# Patient Record
Sex: Female | Born: 1950 | Race: White | Hispanic: No | Marital: Married | Smoking: Former smoker
Health system: Southern US, Community
[De-identification: ages and names within clinical notes are randomized; demographics above are authoritative.]

## PROBLEM LIST (undated history)

## (undated) DIAGNOSIS — K900 Celiac disease: Secondary | ICD-10-CM

## (undated) DIAGNOSIS — M8580 Other specified disorders of bone density and structure, unspecified site: Secondary | ICD-10-CM

## (undated) DIAGNOSIS — Z9852 Vasectomy status: Secondary | ICD-10-CM

## (undated) DIAGNOSIS — N870 Mild cervical dysplasia: Secondary | ICD-10-CM

## (undated) DIAGNOSIS — K649 Unspecified hemorrhoids: Secondary | ICD-10-CM

## (undated) HISTORY — DX: Other specified disorders of bone density and structure, unspecified site: M85.80

## (undated) HISTORY — DX: Celiac disease: K90.0

## (undated) HISTORY — PX: COLPOSCOPY: SHX161

## (undated) HISTORY — DX: Unspecified hemorrhoids: K64.9

## (undated) HISTORY — PX: OTHER SURGICAL HISTORY: SHX169

## (undated) HISTORY — DX: Vasectomy status: Z98.52

## (undated) HISTORY — PX: COSMETIC SURGERY: SHX468

## (undated) HISTORY — DX: Mild cervical dysplasia: N87.0

---

## 1998-03-19 ENCOUNTER — Encounter: Payer: Self-pay | Admitting: Obstetrics and Gynecology

## 1998-03-19 ENCOUNTER — Ambulatory Visit (HOSPITAL_COMMUNITY): Admission: RE | Admit: 1998-03-19 | Discharge: 1998-03-19 | Payer: Self-pay | Admitting: Obstetrics and Gynecology

## 1998-04-05 ENCOUNTER — Encounter: Payer: Self-pay | Admitting: Obstetrics and Gynecology

## 1998-04-05 ENCOUNTER — Ambulatory Visit (HOSPITAL_COMMUNITY): Admission: RE | Admit: 1998-04-05 | Discharge: 1998-04-05 | Payer: Self-pay | Admitting: Obstetrics and Gynecology

## 1998-09-21 ENCOUNTER — Encounter: Payer: Self-pay | Admitting: Obstetrics and Gynecology

## 1998-09-21 ENCOUNTER — Ambulatory Visit (HOSPITAL_COMMUNITY): Admission: RE | Admit: 1998-09-21 | Discharge: 1998-09-21 | Payer: Self-pay | Admitting: Obstetrics and Gynecology

## 1999-10-31 ENCOUNTER — Encounter: Payer: Self-pay | Admitting: Obstetrics and Gynecology

## 1999-10-31 ENCOUNTER — Ambulatory Visit (HOSPITAL_COMMUNITY): Admission: RE | Admit: 1999-10-31 | Discharge: 1999-10-31 | Payer: Self-pay | Admitting: Obstetrics and Gynecology

## 2000-11-22 ENCOUNTER — Encounter: Payer: Self-pay | Admitting: Obstetrics and Gynecology

## 2000-11-22 ENCOUNTER — Ambulatory Visit (HOSPITAL_COMMUNITY): Admission: RE | Admit: 2000-11-22 | Discharge: 2000-11-22 | Payer: Self-pay | Admitting: Obstetrics and Gynecology

## 2001-02-11 ENCOUNTER — Other Ambulatory Visit: Admission: RE | Admit: 2001-02-11 | Discharge: 2001-02-11 | Payer: Self-pay | Admitting: Obstetrics and Gynecology

## 2001-12-17 ENCOUNTER — Ambulatory Visit (HOSPITAL_COMMUNITY): Admission: RE | Admit: 2001-12-17 | Discharge: 2001-12-17 | Payer: Self-pay | Admitting: Obstetrics and Gynecology

## 2001-12-17 ENCOUNTER — Encounter: Payer: Self-pay | Admitting: Obstetrics and Gynecology

## 2001-12-23 ENCOUNTER — Encounter: Payer: Self-pay | Admitting: Obstetrics and Gynecology

## 2001-12-23 ENCOUNTER — Encounter: Admission: RE | Admit: 2001-12-23 | Discharge: 2001-12-23 | Payer: Self-pay | Admitting: Obstetrics and Gynecology

## 2002-05-21 ENCOUNTER — Emergency Department (HOSPITAL_COMMUNITY): Admission: EM | Admit: 2002-05-21 | Discharge: 2002-05-21 | Payer: Self-pay

## 2002-06-27 ENCOUNTER — Encounter: Payer: Self-pay | Admitting: Family Medicine

## 2002-06-27 ENCOUNTER — Ambulatory Visit (HOSPITAL_COMMUNITY): Admission: RE | Admit: 2002-06-27 | Discharge: 2002-06-27 | Payer: Self-pay | Admitting: Family Medicine

## 2003-01-29 ENCOUNTER — Other Ambulatory Visit: Admission: RE | Admit: 2003-01-29 | Discharge: 2003-01-29 | Payer: Self-pay | Admitting: Obstetrics and Gynecology

## 2003-01-29 ENCOUNTER — Ambulatory Visit (HOSPITAL_COMMUNITY): Admission: RE | Admit: 2003-01-29 | Discharge: 2003-01-29 | Payer: Self-pay | Admitting: Obstetrics and Gynecology

## 2003-02-11 ENCOUNTER — Encounter: Admission: RE | Admit: 2003-02-11 | Discharge: 2003-02-11 | Payer: Self-pay | Admitting: Obstetrics and Gynecology

## 2004-02-01 ENCOUNTER — Ambulatory Visit (HOSPITAL_COMMUNITY): Admission: RE | Admit: 2004-02-01 | Discharge: 2004-02-01 | Payer: Self-pay | Admitting: Obstetrics and Gynecology

## 2004-02-23 ENCOUNTER — Encounter: Admission: RE | Admit: 2004-02-23 | Discharge: 2004-02-23 | Payer: Self-pay | Admitting: Obstetrics and Gynecology

## 2004-04-25 ENCOUNTER — Other Ambulatory Visit: Admission: RE | Admit: 2004-04-25 | Discharge: 2004-04-25 | Payer: Self-pay | Admitting: Obstetrics and Gynecology

## 2004-08-17 ENCOUNTER — Other Ambulatory Visit: Admission: RE | Admit: 2004-08-17 | Discharge: 2004-08-17 | Payer: Self-pay | Admitting: Obstetrics and Gynecology

## 2005-01-30 ENCOUNTER — Other Ambulatory Visit: Admission: RE | Admit: 2005-01-30 | Discharge: 2005-01-30 | Payer: Self-pay | Admitting: Obstetrics and Gynecology

## 2005-02-13 ENCOUNTER — Ambulatory Visit (HOSPITAL_COMMUNITY): Admission: RE | Admit: 2005-02-13 | Discharge: 2005-02-13 | Payer: Self-pay | Admitting: Obstetrics and Gynecology

## 2006-03-08 ENCOUNTER — Ambulatory Visit (HOSPITAL_COMMUNITY): Admission: RE | Admit: 2006-03-08 | Discharge: 2006-03-08 | Payer: Self-pay | Admitting: Obstetrics and Gynecology

## 2006-03-27 ENCOUNTER — Other Ambulatory Visit: Admission: RE | Admit: 2006-03-27 | Discharge: 2006-03-27 | Payer: Self-pay | Admitting: Obstetrics and Gynecology

## 2006-05-12 ENCOUNTER — Emergency Department (HOSPITAL_COMMUNITY): Admission: EM | Admit: 2006-05-12 | Discharge: 2006-05-12 | Payer: Self-pay | Admitting: Emergency Medicine

## 2007-04-01 ENCOUNTER — Other Ambulatory Visit: Admission: RE | Admit: 2007-04-01 | Discharge: 2007-04-01 | Payer: Self-pay | Admitting: Obstetrics and Gynecology

## 2007-04-01 ENCOUNTER — Ambulatory Visit (HOSPITAL_COMMUNITY): Admission: RE | Admit: 2007-04-01 | Discharge: 2007-04-01 | Payer: Self-pay | Admitting: Obstetrics and Gynecology

## 2007-10-28 ENCOUNTER — Ambulatory Visit: Payer: Self-pay | Admitting: Obstetrics and Gynecology

## 2007-10-28 ENCOUNTER — Encounter: Payer: Self-pay | Admitting: Obstetrics and Gynecology

## 2007-10-28 ENCOUNTER — Other Ambulatory Visit: Admission: RE | Admit: 2007-10-28 | Discharge: 2007-10-28 | Payer: Self-pay | Admitting: Obstetrics and Gynecology

## 2008-01-13 ENCOUNTER — Ambulatory Visit: Payer: Self-pay | Admitting: Gynecology

## 2008-04-13 ENCOUNTER — Other Ambulatory Visit: Admission: RE | Admit: 2008-04-13 | Discharge: 2008-04-13 | Payer: Self-pay | Admitting: Obstetrics and Gynecology

## 2008-04-13 ENCOUNTER — Encounter: Payer: Self-pay | Admitting: Obstetrics and Gynecology

## 2008-04-13 ENCOUNTER — Ambulatory Visit: Payer: Self-pay | Admitting: Obstetrics and Gynecology

## 2008-04-20 ENCOUNTER — Ambulatory Visit (HOSPITAL_COMMUNITY): Admission: RE | Admit: 2008-04-20 | Discharge: 2008-04-20 | Payer: Self-pay | Admitting: Obstetrics and Gynecology

## 2008-11-15 ENCOUNTER — Emergency Department (HOSPITAL_BASED_OUTPATIENT_CLINIC_OR_DEPARTMENT_OTHER): Admission: EM | Admit: 2008-11-15 | Discharge: 2008-11-15 | Payer: Self-pay | Admitting: Emergency Medicine

## 2009-04-19 ENCOUNTER — Other Ambulatory Visit: Admission: RE | Admit: 2009-04-19 | Discharge: 2009-04-19 | Payer: Self-pay | Admitting: Obstetrics and Gynecology

## 2009-04-19 ENCOUNTER — Ambulatory Visit: Payer: Self-pay | Admitting: Obstetrics and Gynecology

## 2009-04-21 ENCOUNTER — Ambulatory Visit: Payer: Self-pay | Admitting: Obstetrics and Gynecology

## 2009-04-26 ENCOUNTER — Ambulatory Visit (HOSPITAL_COMMUNITY): Admission: RE | Admit: 2009-04-26 | Discharge: 2009-04-26 | Payer: Self-pay | Admitting: Obstetrics and Gynecology

## 2009-10-11 ENCOUNTER — Ambulatory Visit: Payer: Self-pay | Admitting: Women's Health

## 2010-01-23 ENCOUNTER — Encounter: Payer: Self-pay | Admitting: Obstetrics and Gynecology

## 2010-03-21 ENCOUNTER — Other Ambulatory Visit: Payer: Self-pay | Admitting: Obstetrics and Gynecology

## 2010-03-21 DIAGNOSIS — Z1231 Encounter for screening mammogram for malignant neoplasm of breast: Secondary | ICD-10-CM

## 2010-04-25 ENCOUNTER — Encounter (INDEPENDENT_AMBULATORY_CARE_PROVIDER_SITE_OTHER): Payer: BC Managed Care – PPO | Admitting: Obstetrics and Gynecology

## 2010-04-25 ENCOUNTER — Other Ambulatory Visit: Payer: Self-pay | Admitting: Obstetrics and Gynecology

## 2010-04-25 ENCOUNTER — Other Ambulatory Visit (HOSPITAL_COMMUNITY)
Admission: RE | Admit: 2010-04-25 | Discharge: 2010-04-25 | Disposition: A | Discharge status: Home / Self Care | Payer: BC Managed Care – PPO | Source: Ambulatory Visit | Attending: Obstetrics and Gynecology | Admitting: Obstetrics and Gynecology

## 2010-04-25 DIAGNOSIS — Z124 Encounter for screening for malignant neoplasm of cervix: Secondary | ICD-10-CM | POA: Insufficient documentation

## 2010-04-25 DIAGNOSIS — Z01419 Encounter for gynecological examination (general) (routine) without abnormal findings: Secondary | ICD-10-CM

## 2010-04-25 DIAGNOSIS — R82998 Other abnormal findings in urine: Secondary | ICD-10-CM

## 2010-05-02 ENCOUNTER — Ambulatory Visit (HOSPITAL_COMMUNITY): Payer: BC Managed Care – PPO

## 2010-05-03 ENCOUNTER — Ambulatory Visit (HOSPITAL_COMMUNITY): Payer: BC Managed Care – PPO

## 2010-05-09 ENCOUNTER — Ambulatory Visit (HOSPITAL_COMMUNITY)
Admission: RE | Admit: 2010-05-09 | Discharge: 2010-05-09 | Disposition: A | Discharge status: Home / Self Care | Payer: BC Managed Care – PPO | Source: Ambulatory Visit | Attending: Obstetrics and Gynecology | Admitting: Obstetrics and Gynecology

## 2010-05-09 DIAGNOSIS — Z1231 Encounter for screening mammogram for malignant neoplasm of breast: Secondary | ICD-10-CM | POA: Insufficient documentation

## 2010-09-09 ENCOUNTER — Other Ambulatory Visit: Payer: Self-pay

## 2010-09-09 MED ORDER — SULFAMETHOXAZOLE-TRIMETHOPRIM 800-160 MG PO TABS: 1.0000 | ORAL_TABLET | Freq: Every day | ORAL | Status: DC

## 2010-09-09 NOTE — Telephone Encounter (Signed)
Patient requests refill on generic Septra DS that she uses after intercourse to prevent UTI.  Her last CE was 04/25/10. OK?  [facility] uses [facility] [address] and I have put this on file in her EMR.

## 2010-09-09 NOTE — Telephone Encounter (Signed)
Okay 

## 2010-09-09 NOTE — Telephone Encounter (Signed)
Dr. Reece Agar got hardcopy chart before message had routed to him and came to me to tell me refill okay on patient's Septra Ds RX. He okayed #30 with 4 refills and I escribed it to [facility].

## 2010-11-08 ENCOUNTER — Ambulatory Visit (INDEPENDENT_AMBULATORY_CARE_PROVIDER_SITE_OTHER): Payer: BC Managed Care – PPO | Admitting: Obstetrics and Gynecology

## 2010-11-08 ENCOUNTER — Encounter: Payer: Self-pay | Admitting: Obstetrics and Gynecology

## 2010-11-08 DIAGNOSIS — M8580 Other specified disorders of bone density and structure, unspecified site: Secondary | ICD-10-CM | POA: Insufficient documentation

## 2010-11-08 DIAGNOSIS — N898 Other specified noninflammatory disorders of vagina: Secondary | ICD-10-CM

## 2010-11-08 DIAGNOSIS — B373 Candidiasis of vulva and vagina: Secondary | ICD-10-CM

## 2010-11-08 DIAGNOSIS — E039 Hypothyroidism, unspecified: Secondary | ICD-10-CM | POA: Insufficient documentation

## 2010-11-08 DIAGNOSIS — L293 Anogenital pruritus, unspecified: Secondary | ICD-10-CM

## 2010-11-08 DIAGNOSIS — Z9852 Vasectomy status: Secondary | ICD-10-CM | POA: Insufficient documentation

## 2010-11-08 DIAGNOSIS — K649 Unspecified hemorrhoids: Secondary | ICD-10-CM

## 2010-11-08 DIAGNOSIS — N870 Mild cervical dysplasia: Secondary | ICD-10-CM | POA: Insufficient documentation

## 2010-11-08 MED ORDER — FLUCONAZOLE 150 MG PO TABS: 150.0000 mg | ORAL_TABLET | Freq: Once | ORAL | Status: AC

## 2010-11-08 NOTE — Progress Notes (Signed)
The patient came to see me today with a one-week history of itching at her perineum, feeling that a hemorrhoid had prolapsed, and generalized discomfort in this area.  Exam: Kennon Portela present.  External: Generalized erythema and irritation at perineum. Small hemorrhoid at 9:00 somewhat firm but not hard. BUS: With within normal limits. Vaginal exam: Some discharge. Wet prep positive for yeast. Rectal exam: Negative  Assessment: #1. Yeast vaginitis #2. External hemorrhoid  Plan: Diflucan 150 mg daily for 4 days. Analpram advanced kit. If firmness persists call back and we will refer to a surgeon.

## 2011-04-04 ENCOUNTER — Other Ambulatory Visit: Payer: Self-pay | Admitting: Obstetrics and Gynecology

## 2011-04-18 ENCOUNTER — Other Ambulatory Visit: Payer: Self-pay | Admitting: Obstetrics and Gynecology

## 2011-04-18 DIAGNOSIS — Z1231 Encounter for screening mammogram for malignant neoplasm of breast: Secondary | ICD-10-CM

## 2011-04-20 ENCOUNTER — Other Ambulatory Visit: Payer: Self-pay | Admitting: Obstetrics and Gynecology

## 2011-04-20 DIAGNOSIS — M8580 Other specified disorders of bone density and structure, unspecified site: Secondary | ICD-10-CM

## 2011-04-26 ENCOUNTER — Ambulatory Visit (INDEPENDENT_AMBULATORY_CARE_PROVIDER_SITE_OTHER): Payer: BC Managed Care – PPO

## 2011-04-26 DIAGNOSIS — M949 Disorder of cartilage, unspecified: Secondary | ICD-10-CM

## 2011-04-26 DIAGNOSIS — M899 Disorder of bone, unspecified: Secondary | ICD-10-CM

## 2011-04-26 DIAGNOSIS — M8580 Other specified disorders of bone density and structure, unspecified site: Secondary | ICD-10-CM

## 2011-05-01 ENCOUNTER — Other Ambulatory Visit (HOSPITAL_COMMUNITY)
Admission: RE | Admit: 2011-05-01 | Discharge: 2011-05-01 | Disposition: A | Discharge status: Home / Self Care | Payer: BC Managed Care – PPO | Source: Ambulatory Visit | Attending: Obstetrics and Gynecology | Admitting: Obstetrics and Gynecology

## 2011-05-01 ENCOUNTER — Ambulatory Visit (INDEPENDENT_AMBULATORY_CARE_PROVIDER_SITE_OTHER): Payer: BC Managed Care – PPO | Admitting: Obstetrics and Gynecology

## 2011-05-01 ENCOUNTER — Encounter: Payer: Self-pay | Admitting: Obstetrics and Gynecology

## 2011-05-01 VITALS — BP 124/78 | Ht 68.0 in | Wt 188.0 lb

## 2011-05-01 DIAGNOSIS — Z01419 Encounter for gynecological examination (general) (routine) without abnormal findings: Secondary | ICD-10-CM | POA: Insufficient documentation

## 2011-05-01 DIAGNOSIS — K900 Celiac disease: Secondary | ICD-10-CM | POA: Insufficient documentation

## 2011-05-01 DIAGNOSIS — K649 Unspecified hemorrhoids: Secondary | ICD-10-CM | POA: Insufficient documentation

## 2011-05-01 NOTE — Progress Notes (Signed)
Patient came to see me today for her annual exam. She is doing well without hormone replacement. Bone density showed stability with low bone mass and without an elevated FRAX risk. She is doing well with her hyperthyroidism which has now been replaced with Synthroid. She continues to use the analpram I give her for hemorrhoids. She is having no vaginal bleeding. She is having no pelvic pain. She is up-to-date on her mammograms. She has not had CIN on Pap smear since 2006.  Physical examination: Kennon Portela present. HEENT within normal limits. Neck: Thyroid not large. No masses. Supraclavicular nodes: not enlarged. Breasts: Examined in both sitting and lying  position. No skin changes and no masses. Abdomen: Soft no guarding rebound or masses or hernia. Pelvic: External: Within normal limits. BUS: Within normal limits. Vaginal:within normal limits. Good estrogen effect. No evidence of cystocele rectocele or enterocele. Cervix: clean. Uterus: Normal size and shape. Adnexa: No masses. Rectovaginal exam: Confirmatory and negative. Extremities: Within normal limits.  Assessment: #1. Hyperthyroidism- now on Synthroid. #2. Osteopenia #3. Hemorrhoids  Plan: Mammogram in May. Periodic bone densities.Analpram as needed. Patient to discuss with GI doctor banding of hemorrhoids.

## 2011-05-02 LAB — URINALYSIS W MICROSCOPIC + REFLEX CULTURE
Protein, ur: NEGATIVE mg/dL
Specific Gravity, Urine: 1.013 (ref 1.005–1.030)
Urobilinogen, UA: 0.2 mg/dL (ref 0.0–1.0)

## 2011-05-02 LAB — CBC WITH DIFFERENTIAL/PLATELET
Eosinophils Absolute: 0.1 10*3/uL (ref 0.0–0.7)
Eosinophils Relative: 2 % (ref 0–5)
HCT: 43.5 % (ref 36.0–46.0)
Hemoglobin: 14 g/dL (ref 12.0–15.0)
Lymphs Abs: 1.7 10*3/uL (ref 0.7–4.0)
MCH: 29.9 pg (ref 26.0–34.0)
MCV: 92.9 fL (ref 78.0–100.0)
Monocytes Absolute: 0.3 10*3/uL (ref 0.1–1.0)
Monocytes Relative: 7 % (ref 3–12)
RBC: 4.68 MIL/uL (ref 3.87–5.11)

## 2011-05-02 LAB — LIPID PANEL
Cholesterol: 190 mg/dL (ref 0–200)
LDL Cholesterol: 116 mg/dL — ABNORMAL HIGH (ref 0–99)
Total CHOL/HDL Ratio: 3.3 Ratio
VLDL: 16 mg/dL (ref 0–40)

## 2011-05-04 LAB — URINE CULTURE

## 2011-05-15 ENCOUNTER — Ambulatory Visit (HOSPITAL_COMMUNITY): Payer: BC Managed Care – PPO

## 2011-06-12 ENCOUNTER — Ambulatory Visit (HOSPITAL_COMMUNITY)
Admission: RE | Admit: 2011-06-12 | Discharge: 2011-06-12 | Disposition: A | Discharge status: Home / Self Care | Payer: BC Managed Care – PPO | Source: Ambulatory Visit | Attending: Obstetrics and Gynecology | Admitting: Obstetrics and Gynecology

## 2011-06-12 DIAGNOSIS — Z1231 Encounter for screening mammogram for malignant neoplasm of breast: Secondary | ICD-10-CM | POA: Insufficient documentation

## 2011-11-09 ENCOUNTER — Other Ambulatory Visit: Payer: Self-pay | Admitting: Gynecology

## 2012-06-10 ENCOUNTER — Other Ambulatory Visit: Payer: Self-pay | Admitting: Gynecology

## 2012-06-10 DIAGNOSIS — Z1231 Encounter for screening mammogram for malignant neoplasm of breast: Secondary | ICD-10-CM

## 2012-06-21 ENCOUNTER — Ambulatory Visit (HOSPITAL_COMMUNITY)
Admission: RE | Admit: 2012-06-21 | Discharge: 2012-06-21 | Disposition: A | Discharge status: Home / Self Care | Payer: BC Managed Care – PPO | Source: Ambulatory Visit | Attending: Gynecology | Admitting: Gynecology

## 2012-06-21 DIAGNOSIS — Z1231 Encounter for screening mammogram for malignant neoplasm of breast: Secondary | ICD-10-CM | POA: Insufficient documentation

## 2012-08-05 ENCOUNTER — Ambulatory Visit (INDEPENDENT_AMBULATORY_CARE_PROVIDER_SITE_OTHER): Payer: BC Managed Care – PPO | Admitting: Women's Health

## 2012-08-05 ENCOUNTER — Encounter: Payer: Self-pay | Admitting: Women's Health

## 2012-08-05 VITALS — BP 120/80 | Ht 68.0 in | Wt 175.0 lb

## 2012-08-05 DIAGNOSIS — Z23 Encounter for immunization: Secondary | ICD-10-CM

## 2012-08-05 DIAGNOSIS — Z01419 Encounter for gynecological examination (general) (routine) without abnormal findings: Secondary | ICD-10-CM

## 2012-08-05 MED ORDER — HYDROCORTISONE ACE-PRAMOXINE 2.5-1 % RE CREA: TOPICAL_CREAM | Freq: Two times a day (BID) | RECTAL | Status: DC

## 2012-08-05 NOTE — Patient Instructions (Addendum)
Health Recommendations for Postmenopausal Women Respected and ongoing research has looked at the most common causes of death, disability, and poor quality of life in postmenopausal women. The causes include heart disease, diseases of blood vessels, diabetes, depression, cancer, and bone loss (osteoporosis). Many things can be done to help lower the chances of developing these and other common problems: CARDIOVASCULAR DISEASE Heart Disease: A heart attack is a medical emergency. Know the signs and symptoms of a heart attack. Below are things women can do to reduce their risk for heart disease.   Do not smoke. If you smoke, quit.  Aim for a healthy weight. Being overweight causes many preventable deaths. Eat a healthy and balanced diet and drink an adequate amount of liquids.  Get moving. Make a commitment to be more physically active. Aim for 30 minutes of activity on most, if not all days of the week.  Eat for heart health. Choose a diet that is low in saturated fat and cholesterol and eliminate trans fat. Include whole grains, vegetables, and fruits. Read and understand the labels on food containers before buying.  Know your numbers. Ask your caregiver to check your blood pressure, cholesterol (total, HDL, LDL, triglycerides) and blood glucose. Work with your caregiver on improving your entire clinical picture.  High blood pressure. Limit or stop your table salt intake (try salt substitute and food seasonings). Avoid salty foods and drinks. Read labels on food containers before buying. Eating well and exercising can help control high blood pressure. STROKE  Stroke is a medical emergency. Stroke may be the result of a blood clot in a blood vessel in the brain or by a brain hemorrhage (bleeding). Know the signs and symptoms of a stroke. To lower the risk of developing a stroke:  Avoid fatty foods.  Quit smoking.  Control your diabetes, blood pressure, and irregular heart rate. THROMBOPHLEBITIS  (BLOOD CLOT) OF THE LEG  Becoming overweight and leading a stationary lifestyle may also contribute to developing blood clots. Controlling your diet and exercising will help lower the risk of developing blood clots. CANCER SCREENING  Breast Cancer: Take steps to reduce your risk of breast cancer.  You should practice "breast self-awareness." This means understanding the normal appearance and feel of your breasts and should include breast self-examination. Any changes detected, no matter how small, should be reported to your caregiver.  After age 40, you should have a clinical breast exam (CBE) every year.  Starting at age 40, you should consider having a mammogram (breast X-ray) every year.  If you have a family history of breast cancer, talk to your caregiver about genetic screening.  If you are at high risk for breast cancer, talk to your caregiver about having an MRI and a mammogram every year.  Intestinal or Stomach Cancer: Tests to consider are a rectal exam, fecal occult blood, sigmoidoscopy, and colonoscopy. Women who are high risk may need to be screened at an earlier age and more often.  Cervical Cancer:  Beginning at age 30, you should have a Pap test every 3 years as long as the past 3 Pap tests have been normal.  If you have had past treatment for cervical cancer or a condition that could lead to cancer, you need Pap tests and screening for cancer for at least 20 years after your treatment.  If you had a hysterectomy for a problem that was not cancer or a condition that could lead to cancer, then you no longer need Pap tests.    If you are between ages 65 and 70, and you have had normal Pap tests going back 10 years, you no longer need Pap tests.  If Pap tests have been discontinued, risk factors (such as a new sexual partner) need to be reassessed to determine if screening should be resumed.  Some medical problems can increase the chance of getting cervical cancer. In these  cases, your caregiver may recommend more frequent screening and Pap tests.  Uterine Cancer: If you have vaginal bleeding after reaching menopause, you should notify your caregiver.  Ovarian cancer: Other than yearly pelvic exams, there are no reliable tests available to screen for ovarian cancer at this time except for yearly pelvic exams.  Lung Cancer: Yearly chest X-rays can detect lung cancer and should be done on high risk women, such as cigarette smokers and women with chronic lung disease (emphysema).  Skin Cancer: A complete body skin exam should be done at your yearly examination. Avoid overexposure to the sun and ultraviolet light lamps. Use a strong sun block cream when in the sun. All of these things are important in lowering the risk of skin cancer. MENOPAUSE Menopause Symptoms: Hormone therapy products are effective for treating symptoms associated with menopause:  Moderate to severe hot flashes.  Night sweats.  Mood swings.  Headaches.  Tiredness.  Loss of sex drive.  Insomnia.  Other symptoms. Hormone replacement carries certain risks, especially in older women. Women who use or are thinking about using estrogen or estrogen with progestin treatments should discuss that with their caregiver. Your caregiver will help you understand the benefits and risks. The ideal dose of hormone replacement therapy is not known. The Food and Drug Administration (FDA) has concluded that hormone therapy should be used only at the lowest doses and for the shortest amount of time to reach treatment goals.  OSTEOPOROSIS Protecting Against Bone Loss and Preventing Fracture: If you use hormone therapy for prevention of bone loss (osteoporosis), the risks for bone loss must outweigh the risk of the therapy. Ask your caregiver about other medications known to be safe and effective for preventing bone loss and fractures. To guard against bone loss or fractures, the following is recommended:  If  you are less than age 50, take 1000 mg of calcium and at least 600 mg of Vitamin D per day.  If you are greater than age 50 but less than age 70, take 1200 mg of calcium and at least 600 mg of Vitamin D per day.  If you are greater than age 70, take 1200 mg of calcium and at least 800 mg of Vitamin D per day. Smoking and excessive alcohol intake increases the risk of osteoporosis. Eat foods rich in calcium and vitamin D and do weight bearing exercises several times a week as your caregiver suggests. DIABETES Diabetes Melitus: If you have Type I or Type 2 diabetes, you should keep your blood sugar under control with diet, exercise and recommended medication. Avoid too many sweets, starchy and fatty foods. Being overweight can make control more difficult. COGNITION AND MEMORY Cognition and Memory: Menopausal hormone therapy is not recommended for the prevention of cognitive disorders such as Alzheimer's disease or memory loss.  DEPRESSION  Depression may occur at any age, but is common in elderly women. The reasons may be because of physical, medical, social (loneliness), or financial problems and needs. If you are experiencing depression because of medical problems and control of symptoms, talk to your caregiver about this. Physical activity and   exercise may help with mood and sleep. Community and volunteer involvement may help your sense of value and worth. If you have depression and you feel that the problem is getting worse or becoming severe, talk to your caregiver about treatment options that are best for you. ACCIDENTS  Accidents are common and can be serious in the elderly woman. Prepare your house to prevent accidents. Eliminate throw rugs, place hand bars in the bath, shower and toilet areas. Avoid wearing high heeled shoes or walking on wet, snowy, and icy areas. Limit or stop driving if you have vision or hearing problems, or you feel you are unsteady with you movements and  reflexes. HEPATITIS C Hepatitis C is a type of viral infection affecting the liver. It is spread mainly through contact with blood from an infected person. It can be treated, but if left untreated, it can lead to severe liver damage over years. Many people who are infected do not know that the virus is in their blood. If you are a "baby-boomer", it is recommended that you have one screening test for Hepatitis C. IMMUNIZATIONS  Several immunizations are important to consider having during your senior years, including:   Tetanus, diptheria, and pertussis booster shot.  Influenza every year before the flu season begins.  Pneumonia vaccine.  Shingles vaccine.  Others as indicated based on your specific needs. Talk to your caregiver about these. Document Released: 02/10/2005 Document Revised: 12/06/2011 Document Reviewed: 10/07/2007 Exit[employer] Patient Information 2014 [employer].  

## 2012-08-05 NOTE — Progress Notes (Signed)
Donna Haney 1950-12-02 [id number]    History:    The patient presents for annual exam.  Postmenopausal on no HRT/no bleeding. Quit smoking last year. 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 Osteopenia, T score -2.0 left femoral neck FRAX 9.5%/2%. Normal mammogram history. Negative colonoscopy 2011. Has had some problems with hemorrhoids with good relief with Analpram.  Past medical history, past surgical history, family history and social history were all reviewed and documented in the EPIC chart. 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 Has had some problems with low-back pain. Hypothyroidism iliac primary care manages labs and meds. Grandson Donna Haney 45mo had a cord injury at birth making upper body strength weak, doing better still not walking.  ROS:  A  ROS was performed and pertinent positives and negatives are included in the history.  Exam:  Filed Vitals:   08/05/12 1529  BP: 120/80    General appearance:  Normal Head/Neck:  Normal, without cervical or supraclavicular adenopathy. Thyroid:  Symmetrical, normal in size, without palpable masses or nodularity. Respiratory  Effort:  Normal  Auscultation:  Clear without wheezing or rhonchi Cardiovascular  Auscultation:  Regular rate, without rubs, murmurs or gallops  Edema/varicosities:  Not grossly evident Abdominal  Soft,nontender, without masses, guarding or rebound.  Liver/spleen:  No organomegaly noted  Hernia:  None appreciated  Skin  Inspection:  Grossly normal  Palpation:  Grossly normal Neurologic/psychiatric  Orientation:  Normal with appropriate conversation.  Mood/affect:  Normal  Genitourinary    Breasts: Examined lying and sitting.     Right: Without masses, retractions, discharge or axillary adenopathy.     Left: Without masses, retractions, discharge or axillary adenopathy.   Inguinal/mons:  Normal without inguinal adenopathy  External genitalia:   Normal  BUS/Urethra/Skene's glands:  Normal  Bladder:  Normal  Vagina:  Normal  Cervix:  Normal  Uterus:   normal in size, shape and contour.  Midline and mobile  Adnexa/parametria:     Rt: Without masses or tenderness.   Lt: Without masses or tenderness.  Anus and perineum: Normal  Digital rectal exam: Normal sphincter tone without palpated masses or tenderness  Assessment/Plan:  62 y.o. DW F. G2 P2 for annual exam with no complaints.  Postmenopausal/no bleeding/no HRT Osteopenic  without elevated FRAX Hypothyroid/celiac-primary care labs and meds  Name: [name], continue annual mammogram, noted breasts were dense reviewed 3-D tomography and encouraged. Analpram prescription for occasional hemorrhoids layers given. Congratulated on quitting smoking. Encouraged to continue active lifestyle, regular exercise, calcium rich foods, vitamin D 2000 daily. Home safety, fall prevention and importance of exercise for bone health reviewed. Pap, CIN-1 2006. Condoms encouraged if become sexually active. Tdap vaccine given.     [facility], 4:51 PM 08/05/2012

## 2012-08-07 ENCOUNTER — Encounter: Payer: Self-pay | Admitting: Gynecology

## 2013-05-30 ENCOUNTER — Other Ambulatory Visit: Payer: Self-pay | Admitting: Occupational Medicine

## 2013-05-30 ENCOUNTER — Ambulatory Visit: Payer: Self-pay

## 2013-05-30 DIAGNOSIS — M25561 Pain in right knee: Secondary | ICD-10-CM

## 2013-06-09 ENCOUNTER — Other Ambulatory Visit: Payer: Self-pay | Admitting: Women's Health

## 2013-06-09 ENCOUNTER — Telehealth: Payer: Self-pay | Admitting: *Deleted

## 2013-06-09 MED ORDER — SULFAMETHOXAZOLE-TRIMETHOPRIM 800-160 MG PO TABS: 1.0000 | ORAL_TABLET | Freq: Every day | ORAL | Status: DC

## 2013-06-09 NOTE — Telephone Encounter (Signed)
Ok, review/ask if ever tried a lower dose such as macrodantin 50mg ?  If not she could try, review to take with food.  #30.  If doesn't want to try ok for septra, obviously doesn't use often.

## 2013-06-09 NOTE — Telephone Encounter (Signed)
Pt would like to keep Rx for septra, rx sent.

## 2013-06-09 NOTE — Telephone Encounter (Signed)
(  pt aware you are out of the office) Pt called request refill for Bactrim DS that is used for after sex to prevent UTI's. Pt said Dr.Gottensgen prescribed this originally her would give #30 with refills annual due in august 2015. Please advise

## 2013-06-09 NOTE — Telephone Encounter (Signed)
Left message for pt to call.

## 2013-06-23 ENCOUNTER — Other Ambulatory Visit: Payer: Self-pay | Admitting: Women's Health

## 2013-06-23 DIAGNOSIS — Z1231 Encounter for screening mammogram for malignant neoplasm of breast: Secondary | ICD-10-CM

## 2013-06-27 ENCOUNTER — Ambulatory Visit (HOSPITAL_COMMUNITY)
Admission: RE | Admit: 2013-06-27 | Discharge: 2013-06-27 | Disposition: A | Discharge status: Home / Self Care | Payer: BC Managed Care – PPO | Source: Ambulatory Visit | Attending: Women's Health | Admitting: Women's Health

## 2013-06-27 DIAGNOSIS — Z1231 Encounter for screening mammogram for malignant neoplasm of breast: Secondary | ICD-10-CM | POA: Insufficient documentation

## 2013-08-08 ENCOUNTER — Ambulatory Visit (INDEPENDENT_AMBULATORY_CARE_PROVIDER_SITE_OTHER): Payer: BC Managed Care – PPO | Admitting: Women's Health

## 2013-08-08 ENCOUNTER — Other Ambulatory Visit (HOSPITAL_COMMUNITY)
Admission: RE | Admit: 2013-08-08 | Discharge: 2013-08-08 | Disposition: A | Discharge status: Home / Self Care | Payer: BC Managed Care – PPO | Source: Ambulatory Visit | Attending: Women's Health | Admitting: Women's Health

## 2013-08-08 ENCOUNTER — Encounter: Payer: Self-pay | Admitting: Women's Health

## 2013-08-08 VITALS — BP 115/76 | Ht 68.0 in | Wt 165.6 lb

## 2013-08-08 DIAGNOSIS — M8580 Other specified disorders of bone density and structure, unspecified site: Secondary | ICD-10-CM

## 2013-08-08 DIAGNOSIS — M949 Disorder of cartilage, unspecified: Secondary | ICD-10-CM

## 2013-08-08 DIAGNOSIS — Z01419 Encounter for gynecological examination (general) (routine) without abnormal findings: Secondary | ICD-10-CM

## 2013-08-08 DIAGNOSIS — Z113 Encounter for screening for infections with a predominantly sexual mode of transmission: Secondary | ICD-10-CM

## 2013-08-08 DIAGNOSIS — N390 Urinary tract infection, site not specified: Secondary | ICD-10-CM

## 2013-08-08 DIAGNOSIS — Z1151 Encounter for screening for human papillomavirus (HPV): Secondary | ICD-10-CM | POA: Insufficient documentation

## 2013-08-08 DIAGNOSIS — K649 Unspecified hemorrhoids: Secondary | ICD-10-CM

## 2013-08-08 DIAGNOSIS — M899 Disorder of bone, unspecified: Secondary | ICD-10-CM

## 2013-08-08 LAB — VITAMIN D 25 HYDROXY (VIT D DEFICIENCY, FRACTURES): Vit D, 25-Hydroxy: 49 ng/mL (ref 30–89)

## 2013-08-08 LAB — HEPATITIS C ANTIBODY: HCV AB: NEGATIVE

## 2013-08-08 LAB — RPR

## 2013-08-08 LAB — HIV ANTIBODY (ROUTINE TESTING W REFLEX): HIV: NONREACTIVE

## 2013-08-08 LAB — HEPATITIS B SURFACE ANTIGEN: HEP B S AG: NEGATIVE

## 2013-08-08 MED ORDER — HYDROCORTISONE ACE-PRAMOXINE 2.5-1 % RE CREA: TOPICAL_CREAM | Freq: Two times a day (BID) | RECTAL | Status: DC

## 2013-08-08 MED ORDER — NITROFURANTOIN MACROCRYSTAL 50 MG PO CAPS: 50.0000 mg | ORAL_CAPSULE | Freq: Four times a day (QID) | ORAL | Status: DC

## 2013-08-08 NOTE — Patient Instructions (Signed)
Health Recommendations for Postmenopausal Women Respected and ongoing research has looked at the most common causes of death, disability, and poor quality of life in postmenopausal women. The causes include heart disease, diseases of blood vessels, diabetes, depression, cancer, and bone loss (osteoporosis). Many things can be done to help lower the chances of developing these and other common problems. CARDIOVASCULAR DISEASE Heart Disease: A heart attack is a medical emergency. Know the signs and symptoms of a heart attack. Below are things women can do to reduce their risk for heart disease.   Do not smoke. If you smoke, quit.  Aim for a healthy weight. Being overweight causes many preventable deaths. Eat a healthy and balanced diet and drink an adequate amount of liquids.  Get moving. Make a commitment to be more physically active. Aim for 30 minutes of activity on most, if not all days of the week.  Eat for heart health. Choose a diet that is low in saturated fat and cholesterol and eliminate trans fat. Include whole grains, vegetables, and fruits. Read and understand the labels on food containers before buying.  Know your numbers. Ask your caregiver to check your blood pressure, cholesterol (total, HDL, LDL, triglycerides) and blood glucose. Work with your caregiver on improving your entire clinical picture.  High blood pressure. Limit or stop your table salt intake (try salt substitute and food seasonings). Avoid salty foods and drinks. Read labels on food containers before buying. Eating well and exercising can help control high blood pressure. STROKE  Stroke is a medical emergency. Stroke may be the result of a blood clot in a blood vessel in the brain or by a brain hemorrhage (bleeding). Know the signs and symptoms of a stroke. To lower the risk of developing a stroke:  Avoid fatty foods.  Quit smoking.  Control your diabetes, blood pressure, and irregular heart rate. THROMBOPHLEBITIS  (BLOOD CLOT) OF THE LEG  Becoming overweight and leading a stationary lifestyle may also contribute to developing blood clots. Controlling your diet and exercising will help lower the risk of developing blood clots. CANCER SCREENING  Breast Cancer: Take steps to reduce your risk of breast cancer.  You should practice "breast self-awareness." This means understanding the normal appearance and feel of your breasts and should include breast self-examination. Any changes detected, no matter how small, should be reported to your caregiver.  After age 40, you should have a clinical breast exam (CBE) every year.  Starting at age 40, you should consider having a mammogram (breast X-ray) every year.  If you have a family history of breast cancer, talk to your caregiver about genetic screening.  If you are at high risk for breast cancer, talk to your caregiver about having an MRI and a mammogram every year.  Intestinal or Stomach Cancer: Tests to consider are a rectal exam, fecal occult blood, sigmoidoscopy, and colonoscopy. Women who are high risk may need to be screened at an earlier age and more often.  Cervical Cancer:  Beginning at age 30, you should have a Pap test every 3 years as long as the past 3 Pap tests have been normal.  If you have had past treatment for cervical cancer or a condition that could lead to cancer, you need Pap tests and screening for cancer for at least 20 years after your treatment.  If you had a hysterectomy for a problem that was not cancer or a condition that could lead to cancer, then you no longer need Pap tests.    If you are between ages 65 and 70, and you have had normal Pap tests going back 10 years, you no longer need Pap tests.  If Pap tests have been discontinued, risk factors (such as a new sexual partner) need to be reassessed to determine if screening should be resumed.  Some medical problems can increase the chance of getting cervical cancer. In these  cases, your caregiver may recommend more frequent screening and Pap tests.  Uterine Cancer: If you have vaginal bleeding after reaching menopause, you should notify your caregiver.  Ovarian Cancer: Other than yearly pelvic exams, there are no reliable tests available to screen for ovarian cancer at this time except for yearly pelvic exams.  Lung Cancer: Yearly chest X-rays can detect lung cancer and should be done on high risk women, such as cigarette smokers and women with chronic lung disease (emphysema).  Skin Cancer: A complete body skin exam should be done at your yearly examination. Avoid overexposure to the sun and ultraviolet light lamps. Use a strong sun block cream when in the sun. All of these things are important for lowering the risk of skin cancer. MENOPAUSE Menopause Symptoms: Hormone therapy products are effective for treating symptoms associated with menopause:  Moderate to severe hot flashes.  Night sweats.  Mood swings.  Headaches.  Tiredness.  Loss of sex drive.  Insomnia.  Other symptoms. Hormone replacement carries certain risks, especially in older women. Women who use or are thinking about using estrogen or estrogen with progestin treatments should discuss that with their caregiver. Your caregiver will help you understand the benefits and risks. The ideal dose of hormone replacement therapy is not known. The Food and Drug Administration (FDA) has concluded that hormone therapy should be used only at the lowest doses and for the shortest amount of time to reach treatment goals.  OSTEOPOROSIS Protecting Against Bone Loss and Preventing Fracture If you use hormone therapy for prevention of bone loss (osteoporosis), the risks for bone loss must outweigh the risk of the therapy. Ask your caregiver about other medications known to be safe and effective for preventing bone loss and fractures. To guard against bone loss or fractures, the following is recommended:  If  you are younger than age 50, take 1000 mg of calcium and at least 600 mg of Vitamin D per day.  If you are older than age 50 but younger than age 70, take 1200 mg of calcium and at least 600 mg of Vitamin D per day.  If you are older than age 70, take 1200 mg of calcium and at least 800 mg of Vitamin D per day. Smoking and excessive alcohol intake increases the risk of osteoporosis. Eat foods rich in calcium and vitamin D and do weight bearing exercises several times a week as your caregiver suggests. DIABETES Diabetes Mellitus: If you have type I or type 2 diabetes, you should keep your blood sugar under control with diet, exercise, and recommended medication. Avoid starchy and fatty foods, and too many sweets. Being overweight can make diabetes control more difficult. COGNITION AND MEMORY Cognition and Memory: Menopausal hormone therapy is not recommended for the prevention of cognitive disorders such as Alzheimer's disease or memory loss.  DEPRESSION  Depression may occur at any age, but it is common in elderly women. This may be because of physical, medical, social (loneliness), or financial problems and needs. If you are experiencing depression because of medical problems and control of symptoms, talk to your caregiver about this. Physical   activity and exercise may help with mood and sleep. Community and volunteer involvement may improve your sense of value and worth. If you have depression and you feel that the problem is getting worse or becoming severe, talk to your caregiver about which treatment options are best for you. ACCIDENTS  Accidents are common and can be serious in elderly woman. Prepare your house to prevent accidents. Eliminate throw rugs, place hand bars in bath, shower, and toilet areas. Avoid wearing high heeled shoes or walking on wet, snowy, and icy areas. Limit or stop driving if you have vision or hearing problems, or if you feel you are unsteady with your movements and  reflexes. HEPATITIS C Hepatitis C is a type of viral infection affecting the liver. It is spread mainly through contact with blood from an infected person. It can be treated, but if left untreated, it can lead to severe liver damage over the years. Many people who are infected do not know that the virus is in their blood. If you are a "baby-boomer", it is recommended that you have one screening test for Hepatitis C. IMMUNIZATIONS  Several immunizations are important to consider having during your senior years, including:   Tetanus, diphtheria, and pertussis booster shot.  Influenza every year before the flu season begins.  Pneumonia vaccine.  Shingles vaccine.  Others, as indicated based on your specific needs. Talk to your caregiver about these. Document Released: 02/10/2005 Document Revised: 05/05/2013 Document Reviewed: 10/07/2007 Exit[employer] Patient Information 2015 [employer]. This information is not intended to replace advice given to you by your health care provider. Make sure you discuss any questions you have with your health care provider.  

## 2013-08-08 NOTE — Progress Notes (Signed)
Garnett FarmMarjorie L McElfresh 1950-12-08 [id number]    History:    Presents for annual exam.  Postmenopausal/no HRT/no bleeding. 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 Normal mammogram history. 2013 DEXA T score -2 FRAX 9.5%/2%. Quit smoking 2 years ago. Negative colonoscopy 2011. Hypothyroid primary care manages. New partner.  Past medical history, past surgical history, family history and social history were all reviewed and documented in the EPIC chart. 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 Grandson [name] 2-1/2 improving has muscle weakness disorder. Son lives locally, daughter history of alcohol and drug abuse [place] in [place].  ROS:  A  12 point ROS was performed and pertinent positives and negatives are included.  Exam:  Filed Vitals:   08/08/13 0820  BP: 115/76    General appearance:  Normal Thyroid:  Symmetrical, normal in size, without palpable masses or nodularity. Respiratory  Auscultation:  Clear without wheezing or rhonchi Cardiovascular  Auscultation:  Regular rate, without rubs, murmurs or gallops  Edema/varicosities:  Not grossly evident Abdominal  Soft,nontender, without masses, guarding or rebound.  Liver/spleen:  No organomegaly noted  Hernia:  None appreciated  Skin  Inspection:  Grossly normal   Breasts: Examined lying and sitting.     Right: Without masses, retractions, discharge or axillary adenopathy.     Left: Without masses, retractions, discharge or axillary adenopathy. Gentitourinary   Inguinal/mons:  Normal without inguinal adenopathy  External genitalia:  Normal  BUS/Urethra/Skene's glands:  Normal  Vagina:  Normal  Cervix:  Normal  Uterus:   normal in size, shape and contour.  Midline and mobile  Adnexa/parametria:     Rt: Without masses or tenderness.   Lt: Without masses or tenderness.  Anus and perineum: Normal  Digital rectal exam: Normal sphincter tone without palpated masses or tenderness  Assessment/Plan:  63 y.o.DWF G2P2   for annual exam with no complaints.  Postmenopausal/no HRT/no bleeding 2006 LGSIL Osteopenia Hypothyroid labs and meds primary care STD screening History of frequent UTIs after intercourse  Plan: Repeat DEXA, reviewed importance of regular exercise, calcium rich diet, vitamin D 2000. [name], continue annual mammogram, treating tomography reviewed and encouraged history of dense breast. Pap with HR HPV typing, GC/Chlamydia, HIV, hep B, C., RPR, vitamin D. she Septra in the past after intercourse will try Macrodantin 50 mg postcoital, instructed to call if problems with change.  Note: This dictation was prepared with Dragon/digital dictation.  Any transcriptional errors that result are unintentional. Harrington ChallengerYOUNG,Christyanna Mckeon J Springfield HospitalWHNP, 9:10 AM 08/08/2013

## 2013-08-09 LAB — GC/CHLAMYDIA PROBE AMP
CT Probe RNA: NEGATIVE
GC PROBE AMP APTIMA: NEGATIVE

## 2013-08-11 LAB — CYTOLOGY - PAP

## 2013-08-27 ENCOUNTER — Telehealth: Payer: Self-pay | Admitting: *Deleted

## 2013-08-27 NOTE — Telephone Encounter (Signed)
Pt informed with labs result on 08/08/13 all negative STD screen

## 2013-10-13 ENCOUNTER — Encounter: Payer: Self-pay | Admitting: Gynecology

## 2013-10-13 ENCOUNTER — Ambulatory Visit (INDEPENDENT_AMBULATORY_CARE_PROVIDER_SITE_OTHER): Payer: BC Managed Care – PPO | Admitting: Gynecology

## 2013-10-13 VITALS — BP 120/70

## 2013-10-13 DIAGNOSIS — Z23 Encounter for immunization: Secondary | ICD-10-CM

## 2013-10-13 DIAGNOSIS — N390 Urinary tract infection, site not specified: Secondary | ICD-10-CM

## 2013-10-13 DIAGNOSIS — R319 Hematuria, unspecified: Secondary | ICD-10-CM

## 2013-10-13 DIAGNOSIS — R300 Dysuria: Secondary | ICD-10-CM

## 2013-10-13 LAB — URINALYSIS W MICROSCOPIC + REFLEX CULTURE
BILIRUBIN URINE: NEGATIVE
Casts: NONE SEEN
Crystals: NONE SEEN
GLUCOSE, UA: NEGATIVE mg/dL
KETONES UR: NEGATIVE mg/dL
Nitrite: NEGATIVE
PROTEIN: NEGATIVE mg/dL
Specific Gravity, Urine: 1.015 (ref 1.005–1.030)
UROBILINOGEN UA: 0.2 mg/dL (ref 0.0–1.0)
pH: 7 (ref 5.0–8.0)

## 2013-10-13 MED ORDER — FLUCONAZOLE 150 MG PO TABS: 150.0000 mg | ORAL_TABLET | Freq: Once | ORAL | Status: DC

## 2013-10-13 MED ORDER — SULFAMETHOXAZOLE-TMP DS 800-160 MG PO TABS: ORAL_TABLET | ORAL | Status: DC

## 2013-10-13 MED ORDER — PHENAZOPYRIDINE HCL 200 MG PO TABS: 200.0000 mg | ORAL_TABLET | Freq: Three times a day (TID) | ORAL | Status: DC | PRN

## 2013-10-13 NOTE — Progress Notes (Signed)
   [id number] year old patient that presented to the office today stating that this morning she began experiencing dysuria along with frequency. She denied any fever, chills, nausea, vomiting. She did experience some diarrhea yesterday but since then it has resolved. Patient suffers at times for urinary tract infections after intercourse and the past has taken a Bactrim tablet after having sex.  Exam: Back: No CVA tenderness Abdomen: Soft nontender no rebound or guarding no suprapubic tenderness Pelvic exam: Not done  Urinalysis: WBC: 21-50 RBC 11-20 Bacteria many  Assessment/plan: Clinical evidence of urinary tract infection patient will be instructed and prescriptions provided for Bactrim DS to take one by mouth twice a day for 7 days. Prescription for Pyridium 200 mg to take 1 by mouth 3 times a day for 3 days was also provided. In the event of  a used to infection a prescription for Diflucan 150 mg was provided. She was also given a refill of the Bactrim so she can take one after intercourse when necessary. Patient received the flu vaccine today.

## 2013-10-13 NOTE — Patient Instructions (Signed)
Influenza Virus Vaccine (Flucelvax) What is this medicine? INFLUENZA VIRUS VACCINE (in floo EN zuh VAHY ruhs vak SEEN) helps to reduce the risk of getting influenza also known as the flu. The vaccine only helps protect you against some strains of the flu. This medicine may be used for other purposes; ask your health care provider or pharmacist if you have questions. COMMON BRAND NAME(S): FLUCELVAX What should I tell my health care provider before I take this medicine? They need to know if you have any of these conditions: -bleeding disorder like hemophilia -fever or infection -Guillain-Barre syndrome or other neurological problems -immune system problems -infection with the human immunodeficiency virus (HIV) or AIDS -low blood platelet counts -multiple sclerosis -an unusual or allergic reaction to influenza virus vaccine, other medicines, foods, dyes or preservatives -pregnant or trying to get pregnant -breast-feeding How should I use this medicine? This vaccine is for injection into a muscle. It is given by a health care professional. A copy of Vaccine Information Statements will be given before each vaccination. Read this sheet carefully each time. The sheet may change frequently. Talk to your pediatrician regarding the use of this medicine in children. Special care may be needed. Overdosage: If you think you've taken too much of this medicine contact a poison control center or emergency room at once. Overdosage: If you think you have taken too much of this medicine contact a poison control center or emergency room at once. NOTE: This medicine is only for you. Do not share this medicine with others. What if I miss a dose? This does not apply. What may interact with this medicine? -chemotherapy or radiation therapy -medicines that lower your immune system like etanercept, anakinra, infliximab, and adalimumab -medicines that treat or prevent blood clots like  warfarin -phenytoin -steroid medicines like prednisone or cortisone -theophylline -vaccines This list may not describe all possible interactions. Give your health care provider a list of all the medicines, herbs, non-prescription drugs, or dietary supplements you use. Also tell them if you smoke, drink alcohol, or use illegal drugs. Some items may interact with your medicine. What should I watch for while using this medicine? Report any side effects that do not go away within 3 days to your doctor or health care professional. Call your health care provider if any unusual symptoms occur within 6 weeks of receiving this vaccine. You may still catch the flu, but the illness is not usually as bad. You cannot get the flu from the vaccine. The vaccine will not protect against colds or other illnesses that may cause fever. The vaccine is needed every year. What side effects may I notice from receiving this medicine? Side effects that you should report to your doctor or health care professional as soon as possible: -allergic reactions like skin rash, itching or hives, swelling of the face, lips, or tongue Side effects that usually do not require medical attention (Report these to your doctor or health care professional if they continue or are bothersome.): -fever -headache -muscle aches and pains -pain, tenderness, redness, or swelling at the injection site -tiredness This list may not describe all possible side effects. Call your doctor for medical advice about side effects. You may report side effects to FDA at 1-800-FDA-1088. Where should I keep my medicine? The vaccine will be given by a health care professional in a clinic, pharmacy, doctor's office, or other health care setting. You will not be given vaccine doses to store at home. NOTE: This sheet is a summary.   It may not cover all possible information. If you have questions about this medicine, talk to your doctor, pharmacist, or health care  provider.  2015, Elsevier/Gold Standard. (2010-11-30 14:06:47) Urinary Tract Infection Urinary tract infections (UTIs) can develop anywhere along your urinary tract. Your urinary tract is your body's drainage system for removing wastes and extra water. Your urinary tract includes two kidneys, two ureters, a bladder, and a urethra. Your kidneys are a pair of bean-shaped organs. Each kidney is about the size of your fist. They are located below your ribs, one on each side of your spine. CAUSES Infections are caused by microbes, which are microscopic organisms, including fungi, viruses, and bacteria. These organisms are so small that they can only be seen through a microscope. Bacteria are the microbes that most commonly cause UTIs. SYMPTOMS  Symptoms of UTIs may vary by age and gender of the patient and by the location of the infection. Symptoms in young women typically include a frequent and intense urge to urinate and a painful, burning feeling in the bladder or urethra during urination. Older women and men are more likely to be tired, shaky, and weak and have muscle aches and abdominal pain. A fever may mean the infection is in your kidneys. Other symptoms of a kidney infection include pain in your back or sides below the ribs, nausea, and vomiting. DIAGNOSIS To diagnose a UTI, your caregiver will ask you about your symptoms. Your caregiver also will ask to provide a urine sample. The urine sample will be tested for bacteria and white blood cells. White blood cells are made by your body to help fight infection. TREATMENT  Typically, UTIs can be treated with medication. Because most UTIs are caused by a bacterial infection, they usually can be treated with the use of antibiotics. The choice of antibiotic and length of treatment depend on your symptoms and the type of bacteria causing your infection. HOME CARE INSTRUCTIONS  If you were prescribed antibiotics, take them exactly as your caregiver  instructs you. Finish the medication even if you feel better after you have only taken some of the medication.  Drink enough water and fluids to keep your urine clear or pale yellow.  Avoid caffeine, tea, and carbonated beverages. They tend to irritate your bladder.  Empty your bladder often. Avoid holding urine for long periods of time.  Empty your bladder before and after sexual intercourse.  After a bowel movement, women should cleanse from front to back. Use each tissue only once. SEEK MEDICAL CARE IF:   You have back pain.  You develop a fever.  Your symptoms do not begin to resolve within 3 days. SEEK IMMEDIATE MEDICAL CARE IF:   You have severe back pain or lower abdominal pain.  You develop chills.  You have nausea or vomiting.  You have continued burning or discomfort with urination. MAKE SURE YOU:   Understand these instructions.  Will watch your condition.  Will get help right away if you are not doing well or get worse. Document Released: 09/28/2004 Document Revised: 06/20/2011 Document Reviewed: 01/27/2011 [facility] Patient Information 2015 [place], [place]. This information is not intended to replace advice given to you by your health care provider. Make sure you discuss any questions you have with your health care provider.

## 2013-10-13 NOTE — Addendum Note (Signed)
Addended by: Richardson ChiquitoWILKINSON, KARI S on: 10/13/2013 03:44 PM   Modules accepted: Orders

## 2013-10-15 LAB — URINE CULTURE
Colony Count: NO GROWTH
ORGANISM ID, BACTERIA: NO GROWTH

## 2013-10-16 ENCOUNTER — Other Ambulatory Visit: Payer: Self-pay | Admitting: Gynecology

## 2013-10-16 DIAGNOSIS — N3001 Acute cystitis with hematuria: Secondary | ICD-10-CM

## 2013-10-21 ENCOUNTER — Other Ambulatory Visit: Payer: BC Managed Care – PPO

## 2013-10-21 DIAGNOSIS — M8580 Other specified disorders of bone density and structure, unspecified site: Secondary | ICD-10-CM

## 2013-10-21 DIAGNOSIS — N3001 Acute cystitis with hematuria: Secondary | ICD-10-CM

## 2013-10-22 ENCOUNTER — Telehealth: Payer: Self-pay | Admitting: *Deleted

## 2013-10-22 LAB — URINALYSIS W MICROSCOPIC + REFLEX CULTURE
BACTERIA UA: NONE SEEN
Bilirubin Urine: NEGATIVE
CASTS: NONE SEEN
Glucose, UA: NEGATIVE mg/dL
Hgb urine dipstick: NEGATIVE
Ketones, ur: NEGATIVE mg/dL
LEUKOCYTES UA: NEGATIVE
NITRITE: NEGATIVE
PH: 5.5 (ref 5.0–8.0)
PROTEIN: NEGATIVE mg/dL
Specific Gravity, Urine: 1.027 (ref 1.005–1.030)
Squamous Epithelial / LPF: NONE SEEN
Urobilinogen, UA: 1 mg/dL (ref 0.0–1.0)

## 2013-10-22 NOTE — Telephone Encounter (Signed)
Pt called requesting u/a results from 10/21/13, I left message on pt voicemail that culture not back yet.

## 2013-11-03 ENCOUNTER — Encounter: Payer: Self-pay | Admitting: Gynecology

## 2014-05-28 ENCOUNTER — Telehealth: Payer: Self-pay | Admitting: *Deleted

## 2014-05-28 NOTE — Telephone Encounter (Signed)
Pt asked if you know name of ENT MD for consult regarding tonsils. Please advise

## 2014-05-28 NOTE — Telephone Encounter (Signed)
Ask Olegario Messierkathy who Adelina MingsKelsey saw?

## 2014-05-29 NOTE — Telephone Encounter (Signed)
Appointment with Dr.Bates on 06/15/14 @ 2:00pm pt informed.

## 2014-05-29 NOTE — Telephone Encounter (Signed)
Donna Haney will inform pt Dr.Bates # given to call

## 2014-06-02 ENCOUNTER — Other Ambulatory Visit: Payer: Self-pay | Admitting: Women's Health

## 2014-06-02 DIAGNOSIS — Z1231 Encounter for screening mammogram for malignant neoplasm of breast: Secondary | ICD-10-CM

## 2014-07-03 ENCOUNTER — Ambulatory Visit (HOSPITAL_COMMUNITY)
Admission: RE | Admit: 2014-07-03 | Discharge: 2014-07-03 | Disposition: A | Discharge status: Home / Self Care | Payer: BC Managed Care – PPO | Source: Ambulatory Visit | Attending: Women's Health | Admitting: Women's Health

## 2014-07-03 ENCOUNTER — Other Ambulatory Visit: Payer: Self-pay | Admitting: Women's Health

## 2014-07-03 DIAGNOSIS — Z1231 Encounter for screening mammogram for malignant neoplasm of breast: Secondary | ICD-10-CM

## 2014-08-21 ENCOUNTER — Ambulatory Visit (INDEPENDENT_AMBULATORY_CARE_PROVIDER_SITE_OTHER): Payer: BC Managed Care – PPO | Admitting: Women's Health

## 2014-08-21 ENCOUNTER — Encounter: Payer: Self-pay | Admitting: Women's Health

## 2014-08-21 VITALS — BP 112/74 | Ht 68.0 in | Wt 174.2 lb

## 2014-08-21 DIAGNOSIS — Z01419 Encounter for gynecological examination (general) (routine) without abnormal findings: Secondary | ICD-10-CM | POA: Diagnosis not present

## 2014-08-21 DIAGNOSIS — Z13820 Encounter for screening for osteoporosis: Secondary | ICD-10-CM | POA: Diagnosis not present

## 2014-08-21 NOTE — Patient Instructions (Signed)
Health Recommendations for Postmenopausal Women Respected and ongoing research has looked at the most common causes of death, disability, and poor quality of life in postmenopausal women. The causes include heart disease, diseases of blood vessels, diabetes, depression, cancer, and bone loss (osteoporosis). Many things can be done to help lower the chances of developing these and other common problems. CARDIOVASCULAR DISEASE Heart Disease: A heart attack is a medical emergency. Know the signs and symptoms of a heart attack. Below are things women can do to reduce their risk for heart disease.   Do not smoke. If you smoke, quit.  Aim for a healthy weight. Being overweight causes many preventable deaths. Eat a healthy and balanced diet and drink an adequate amount of liquids.  Get moving. Make a commitment to be more physically active. Aim for 30 minutes of activity on most, if not all days of the week.  Eat for heart health. Choose a diet that is low in saturated fat and cholesterol and eliminate trans fat. Include whole grains, vegetables, and fruits. Read and understand the labels on food containers before buying.  Know your numbers. Ask your caregiver to check your blood pressure, cholesterol (total, HDL, LDL, triglycerides) and blood glucose. Work with your caregiver on improving your entire clinical picture.  High blood pressure. Limit or stop your table salt intake (try salt substitute and food seasonings). Avoid salty foods and drinks. Read labels on food containers before buying. Eating well and exercising can help control high blood pressure. STROKE  Stroke is a medical emergency. Stroke may be the result of a blood clot in a blood vessel in the brain or by a brain hemorrhage (bleeding). Know the signs and symptoms of a stroke. To lower the risk of developing a stroke:  Avoid fatty foods.  Quit smoking.  Control your diabetes, blood pressure, and irregular heart rate. THROMBOPHLEBITIS  (BLOOD CLOT) OF THE LEG  Becoming overweight and leading a stationary lifestyle may also contribute to developing blood clots. Controlling your diet and exercising will help lower the risk of developing blood clots. CANCER SCREENING  Breast Cancer: Take steps to reduce your risk of breast cancer.  You should practice "breast self-awareness." This means understanding the normal appearance and feel of your breasts and should include breast self-examination. Any changes detected, no matter how small, should be reported to your caregiver.  After age 40, you should have a clinical breast exam (CBE) every year.  Starting at age 40, you should consider having a mammogram (breast X-ray) every year.  If you have a family history of breast cancer, talk to your caregiver about genetic screening.  If you are at high risk for breast cancer, talk to your caregiver about having an MRI and a mammogram every year.  Intestinal or Stomach Cancer: Tests to consider are a rectal exam, fecal occult blood, sigmoidoscopy, and colonoscopy. Women who are high risk may need to be screened at an earlier age and more often.  Cervical Cancer:  Beginning at age 30, you should have a Pap test every 3 years as long as the past 3 Pap tests have been normal.  If you have had past treatment for cervical cancer or a condition that could lead to cancer, you need Pap tests and screening for cancer for at least 20 years after your treatment.  If you had a hysterectomy for a problem that was not cancer or a condition that could lead to cancer, then you no longer need Pap tests.    If you are between ages 65 and 70, and you have had normal Pap tests going back 10 years, you no longer need Pap tests.  If Pap tests have been discontinued, risk factors (such as a new sexual partner) need to be reassessed to determine if screening should be resumed.  Some medical problems can increase the chance of getting cervical cancer. In these  cases, your caregiver may recommend more frequent screening and Pap tests.  Uterine Cancer: If you have vaginal bleeding after reaching menopause, you should notify your caregiver.  Ovarian Cancer: Other than yearly pelvic exams, there are no reliable tests available to screen for ovarian cancer at this time except for yearly pelvic exams.  Lung Cancer: Yearly chest X-rays can detect lung cancer and should be done on high risk women, such as cigarette smokers and women with chronic lung disease (emphysema).  Skin Cancer: A complete body skin exam should be done at your yearly examination. Avoid overexposure to the sun and ultraviolet light lamps. Use a strong sun block cream when in the sun. All of these things are important for lowering the risk of skin cancer. MENOPAUSE Menopause Symptoms: Hormone therapy products are effective for treating symptoms associated with menopause:  Moderate to severe hot flashes.  Night sweats.  Mood swings.  Headaches.  Tiredness.  Loss of sex drive.  Insomnia.  Other symptoms. Hormone replacement carries certain risks, especially in older women. Women who use or are thinking about using estrogen or estrogen with progestin treatments should discuss that with their caregiver. Your caregiver will help you understand the benefits and risks. The ideal dose of hormone replacement therapy is not known. The Food and Drug Administration (FDA) has concluded that hormone therapy should be used only at the lowest doses and for the shortest amount of time to reach treatment goals.  OSTEOPOROSIS Protecting Against Bone Loss and Preventing Fracture If you use hormone therapy for prevention of bone loss (osteoporosis), the risks for bone loss must outweigh the risk of the therapy. Ask your caregiver about other medications known to be safe and effective for preventing bone loss and fractures. To guard against bone loss or fractures, the following is recommended:  If  you are younger than age 50, take 1000 mg of calcium and at least 600 mg of Vitamin D per day.  If you are older than age 50 but younger than age 70, take 1200 mg of calcium and at least 600 mg of Vitamin D per day.  If you are older than age 70, take 1200 mg of calcium and at least 800 mg of Vitamin D per day. Smoking and excessive alcohol intake increases the risk of osteoporosis. Eat foods rich in calcium and vitamin D and do weight bearing exercises several times a week as your caregiver suggests. DIABETES Diabetes Mellitus: If you have type I or type 2 diabetes, you should keep your blood sugar under control with diet, exercise, and recommended medication. Avoid starchy and fatty foods, and too many sweets. Being overweight can make diabetes control more difficult. COGNITION AND MEMORY Cognition and Memory: Menopausal hormone therapy is not recommended for the prevention of cognitive disorders such as Alzheimer's disease or memory loss.  DEPRESSION  Depression may occur at any age, but it is common in elderly women. This may be because of physical, medical, social (loneliness), or financial problems and needs. If you are experiencing depression because of medical problems and control of symptoms, talk to your caregiver about this. Physical   activity and exercise may help with mood and sleep. Community and volunteer involvement may improve your sense of value and worth. If you have depression and you feel that the problem is getting worse or becoming severe, talk to your caregiver about which treatment options are best for you. ACCIDENTS  Accidents are common and can be serious in elderly woman. Prepare your house to prevent accidents. Eliminate throw rugs, place hand bars in bath, shower, and toilet areas. Avoid wearing high heeled shoes or walking on wet, snowy, and icy areas. Limit or stop driving if you have vision or hearing problems, or if you feel you are unsteady with your movements and  reflexes. HEPATITIS C Hepatitis C is a type of viral infection affecting the liver. It is spread mainly through contact with blood from an infected person. It can be treated, but if left untreated, it can lead to severe liver damage over the years. Many people who are infected do not know that the virus is in their blood. If you are a "baby-boomer", it is recommended that you have one screening test for Hepatitis C. IMMUNIZATIONS  Several immunizations are important to consider having during your senior years, including:   Tetanus, diphtheria, and pertussis booster shot.  Influenza every year before the flu season begins.  Pneumonia vaccine.  Shingles vaccine.  Others, as indicated based on your specific needs. Talk to your caregiver about these. Document Released: 02/10/2005 Document Revised: 05/05/2013 Document Reviewed: 10/07/2007 Exit[employer] Patient Information 2015 [employer]. This information is not intended to replace advice given to you by your health care provider. Make sure you discuss any questions you have with your health care provider.  

## 2014-08-21 NOTE — Progress Notes (Signed)
Donna Haney [birth year] [id number]    History:    Presents for annual exam.  Postmenopausal/no HRT/no bleeding. 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 2011 negative colonoscopy. Mammogram normal history. 2013 DEXA T score -2 at femoral neck FRAX 9.5%/2%. Primary care manages labs and hypothyroidism. Vaccines current.  Past medical history, past surgical history, family history and social history were all reviewed and documented in the EPIC chart. 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 Son's doing well his son has a muscle weakness problem. Daughter continues to struggle with drug and alcohol abuse.  ROS:  A ROS was performed and pertinent positives and negatives are included.  Exam:  Filed Vitals:   08/21/14 1113  BP: 112/74    General appearance:  Normal Thyroid:  Symmetrical, normal in size, without palpable masses or nodularity. Respiratory  Auscultation:  Clear without wheezing or rhonchi Cardiovascular  Auscultation:  Regular rate, without rubs, murmurs or gallops  Edema/varicosities:  Not grossly evident Abdominal  Soft,nontender, without masses, guarding or rebound.  Liver/spleen:  No organomegaly noted  Hernia:  None appreciated  Skin  Inspection:  Grossly normal   Breasts: Examined lying and sitting.     Right: Without masses, retractions, discharge or axillary adenopathy.     Left: Without masses, retractions, discharge or axillary adenopathy. Gentitourinary   Inguinal/mons:  Normal without inguinal adenopathy  External genitalia:  Normal  BUS/Urethra/Skene's glands:  Normal  Vagina:  Normal  Cervix:  Normal  Uterus:   normal in size, shape and contour.  Midline and mobile  Adnexa/parametria:     Rt: Without masses or tenderness.   Lt: Without masses or tenderness.  Anus and perineum: Normal  Digital rectal exam: Normal sphincter tone without palpated masses or tenderness  Assessment/Plan:  64 y.o. MWF G2 P2 for annual exam with no  complaints.  Postmenopausal/no HRT/no bleeding 2006 CIN-1 with normal Paps after Osteopenia without elevated FRAX Hypothyroidism primary care manages labs and meds  Plan: DEXA instructed to schedule. Reviewed importance of exercise, home safety, fall prevention and vitamin D 2000 daily encouraged. [name], continue annual 3-D mammogram history of dense breasts. Pap normal with negative HR HPV in 2015, new screening guidelines reviewed.  Harrington Challenger [facility], 1:47 PM 08/21/2014

## 2015-01-20 ENCOUNTER — Other Ambulatory Visit: Payer: Self-pay | Admitting: Gynecology

## 2015-01-20 DIAGNOSIS — Z13820 Encounter for screening for osteoporosis: Secondary | ICD-10-CM

## 2015-02-22 ENCOUNTER — Other Ambulatory Visit: Payer: Self-pay | Admitting: Gynecology

## 2015-02-22 ENCOUNTER — Ambulatory Visit (INDEPENDENT_AMBULATORY_CARE_PROVIDER_SITE_OTHER): Payer: BC Managed Care – PPO

## 2015-02-22 DIAGNOSIS — Z13820 Encounter for screening for osteoporosis: Secondary | ICD-10-CM

## 2015-02-22 DIAGNOSIS — M8580 Other specified disorders of bone density and structure, unspecified site: Secondary | ICD-10-CM

## 2015-02-22 DIAGNOSIS — M899 Disorder of bone, unspecified: Secondary | ICD-10-CM

## 2015-06-16 ENCOUNTER — Encounter: Payer: Self-pay | Admitting: Women's Health

## 2015-06-16 ENCOUNTER — Ambulatory Visit (INDEPENDENT_AMBULATORY_CARE_PROVIDER_SITE_OTHER): Payer: BC Managed Care – PPO | Admitting: Women's Health

## 2015-06-16 VITALS — BP 126/80 | Ht 68.0 in | Wt 174.0 lb

## 2015-06-16 DIAGNOSIS — K649 Unspecified hemorrhoids: Secondary | ICD-10-CM | POA: Diagnosis not present

## 2015-06-16 DIAGNOSIS — R350 Frequency of micturition: Secondary | ICD-10-CM

## 2015-06-16 DIAGNOSIS — N898 Other specified noninflammatory disorders of vagina: Secondary | ICD-10-CM | POA: Diagnosis not present

## 2015-06-16 LAB — URINALYSIS W MICROSCOPIC + REFLEX CULTURE
Bilirubin Urine: NEGATIVE
Casts: NONE SEEN [LPF]
Crystals: NONE SEEN [HPF]
Glucose, UA: NEGATIVE
Ketones, ur: NEGATIVE
Nitrite: NEGATIVE
Protein, ur: NEGATIVE
Specific Gravity, Urine: 1.01 (ref 1.001–1.035)
Yeast: NONE SEEN [HPF]
pH: 6.5 (ref 5.0–8.0)

## 2015-06-16 LAB — WET PREP FOR TRICH, YEAST, CLUE
Clue Cells Wet Prep HPF POC: NONE SEEN
Trich, Wet Prep: NONE SEEN
Yeast Wet Prep HPF POC: NONE SEEN

## 2015-06-16 MED ORDER — PHENAZOPYRIDINE HCL 100 MG PO TABS: 100.0000 mg | ORAL_TABLET | Freq: Three times a day (TID) | ORAL | Status: DC | PRN

## 2015-06-16 MED ORDER — HYDROCORTISONE ACE-PRAMOXINE 2.5-1 % RE CREA: TOPICAL_CREAM | Freq: Two times a day (BID) | RECTAL | Status: DC

## 2015-06-16 NOTE — Patient Instructions (Signed)

## 2015-06-16 NOTE — Progress Notes (Signed)
Patient ID: Felipa EvenerMarjorie W Omeara, female   DOB: 12-15-1950, 65 y.o.   MRN: [id number] Presents with complaint of increased urinary burning at initiation of urination only, denies pain at end of stream, abdominal pain or fever. Urinary burning started after intercourse 4 days ago and has persisted. Reports questionable small brown blood clots noted with urination but no visible red blood. Denies vaginal discharge, itching or odor. Postmenopausal on no HRT with no bleeding. Reports occasional hemorrhoidal pain requesting cream refill.  Exam: Appears well. External genitalia no visible erythema, bleeding fissures or urethral irritation. One small rectal hemorrhoid. Speculum exam cervix without lesion, discharge, blood or irritation, wet prep negative. UA: +1 blood, +1 leukocytes, 6-10 WBCs, 6-10 RBCs, few bacteria.  Urinary burning with hematuria  Plan: Pyridium 100 mg 3 times daily for 5 days when necessary. Urine culture pending.If  Culture is negative repeat clean-catch UA in 2 weeks. Encouraged vaginal lubricants with intercourse. Analpram cream 2.5% prescription given to use when necessary.

## 2015-06-16 NOTE — Addendum Note (Signed)
Addended by: Kem ParkinsonBARNES, Marnae Madani on: 06/16/2015 04:21 PM   Modules accepted: Orders

## 2015-06-18 LAB — URINE CULTURE
Colony Count: NO GROWTH
Organism ID, Bacteria: NO GROWTH

## 2015-07-14 ENCOUNTER — Other Ambulatory Visit: Payer: Self-pay | Admitting: Women's Health

## 2015-07-14 ENCOUNTER — Telehealth: Payer: Self-pay | Admitting: *Deleted

## 2015-07-14 DIAGNOSIS — Z1231 Encounter for screening mammogram for malignant neoplasm of breast: Secondary | ICD-10-CM

## 2015-07-14 DIAGNOSIS — R350 Frequency of micturition: Secondary | ICD-10-CM

## 2015-07-14 MED ORDER — PHENAZOPYRIDINE HCL 100 MG PO TABS: 100.0000 mg | ORAL_TABLET | Freq: Three times a day (TID) | ORAL | Status: DC | PRN

## 2015-07-14 NOTE — Telephone Encounter (Signed)
Phone call, states continues to have urinary burning after intercourse, will try an injectable type lubricant such as Replens to see if that may help with decreasing urinary meatus irritation. Husband also having ED problems. Will take Pyridium 1-2 doses after intercourse which does help urinary burning sensation.

## 2015-07-14 NOTE — Telephone Encounter (Signed)
Pt called to follow up from office visit on 06/16/15 took pyridium 100 mg and started KY gel as recommended. Pt said she uses the KY gel with intercourse and does fine, but after intercourse she has vaginal burning, sometimes several hours later. She asked if she needs to take pyridium after intercourse every time? Pt asked for your recommendations, you may call on cell if needed.  Please advise

## 2015-07-16 ENCOUNTER — Ambulatory Visit: Payer: Self-pay

## 2015-07-23 ENCOUNTER — Ambulatory Visit
Admission: RE | Admit: 2015-07-23 | Discharge: 2015-07-23 | Disposition: A | Discharge status: Home / Self Care | Payer: BC Managed Care – PPO | Source: Ambulatory Visit | Attending: Women's Health | Admitting: Women's Health

## 2015-07-23 DIAGNOSIS — Z1231 Encounter for screening mammogram for malignant neoplasm of breast: Secondary | ICD-10-CM

## 2015-08-24 ENCOUNTER — Encounter: Payer: Self-pay | Admitting: Women's Health

## 2015-08-24 ENCOUNTER — Ambulatory Visit (INDEPENDENT_AMBULATORY_CARE_PROVIDER_SITE_OTHER): Payer: BC Managed Care – PPO | Admitting: Women's Health

## 2015-08-24 VITALS — BP 122/80 | Ht 68.0 in | Wt 182.0 lb

## 2015-08-24 DIAGNOSIS — Z13220 Encounter for screening for lipoid disorders: Secondary | ICD-10-CM | POA: Diagnosis not present

## 2015-08-24 DIAGNOSIS — R350 Frequency of micturition: Secondary | ICD-10-CM

## 2015-08-24 DIAGNOSIS — Z01419 Encounter for gynecological examination (general) (routine) without abnormal findings: Secondary | ICD-10-CM | POA: Diagnosis not present

## 2015-08-24 DIAGNOSIS — K649 Unspecified hemorrhoids: Secondary | ICD-10-CM

## 2015-08-24 LAB — COMPREHENSIVE METABOLIC PANEL
ALBUMIN: 4.2 g/dL (ref 3.6–5.1)
ALT: 17 U/L (ref 6–29)
AST: 21 U/L (ref 10–35)
Alkaline Phosphatase: 49 U/L (ref 33–130)
BILIRUBIN TOTAL: 0.3 mg/dL (ref 0.2–1.2)
BUN: 17 mg/dL (ref 7–25)
CALCIUM: 9.3 mg/dL (ref 8.6–10.4)
CO2: 26 mmol/L (ref 20–31)
CREATININE: 0.71 mg/dL (ref 0.50–0.99)
Chloride: 104 mmol/L (ref 98–110)
Glucose, Bld: 86 mg/dL (ref 65–99)
Potassium: 3.8 mmol/L (ref 3.5–5.3)
Sodium: 138 mmol/L (ref 135–146)
Total Protein: 7.1 g/dL (ref 6.1–8.1)

## 2015-08-24 LAB — CBC WITH DIFFERENTIAL/PLATELET
Basophils Absolute: 0 cells/uL (ref 0–200)
Basophils Relative: 0 %
Eosinophils Absolute: 44 cells/uL (ref 15–500)
Eosinophils Relative: 1 %
HCT: 41.4 % (ref 35.0–45.0)
Hemoglobin: 13.4 g/dL (ref 11.7–15.5)
LYMPHS PCT: 32 %
Lymphs Abs: 1408 cells/uL (ref 850–3900)
MCH: 29.3 pg (ref 27.0–33.0)
MCHC: 32.4 g/dL (ref 32.0–36.0)
MCV: 90.6 fL (ref 80.0–100.0)
MONO ABS: 396 {cells}/uL (ref 200–950)
MONOS PCT: 9 %
MPV: 9.5 fL (ref 7.5–12.5)
NEUTROS PCT: 58 %
Neutro Abs: 2552 cells/uL (ref 1500–7800)
PLATELETS: 255 10*3/uL (ref 140–400)
RBC: 4.57 MIL/uL (ref 3.80–5.10)
RDW: 13.8 % (ref 11.0–15.0)
WBC: 4.4 10*3/uL (ref 3.8–10.8)

## 2015-08-24 LAB — LIPID PANEL
Cholesterol: 190 mg/dL (ref 125–200)
HDL: 77 mg/dL (ref >=46)
LDL Cholesterol: 100 mg/dL (ref <130)
Total CHOL/HDL Ratio: 2.5 Ratio (ref <=5.0)
Triglycerides: 63 mg/dL (ref <150)
VLDL: 13 mg/dL (ref <30)

## 2015-08-24 MED ORDER — PHENAZOPYRIDINE HCL 100 MG PO TABS: 100.0000 mg | ORAL_TABLET | Freq: Three times a day (TID) | ORAL | 1 refills | Status: DC | PRN

## 2015-08-24 MED ORDER — HYDROCORTISONE ACE-PRAMOXINE 2.5-1 % RE CREA: TOPICAL_CREAM | Freq: Two times a day (BID) | RECTAL | 2 refills | Status: DC

## 2015-08-24 NOTE — Progress Notes (Signed)
Donna EvenerMarjorie W Haney Aug 08, 1950 [id number]    History:    Presents for annual exam.  Postmenopausal on no HRT with no bleeding. 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 2017 negative colonoscopy. Normal mammogram history. 02/2015 T score -1.8 FRAX 13%/1.8% stable DEXA. Hypothyroid endocrinologist manages. Has had Zostavax.  Past medical history, past surgical history, family history and social history were all reviewed and documented in the EPIC chart. Works in the school office under mandating principal. Daughter history of drug and alcohol abuse minimal contact, son is well, grandson has a muscle weakness problem.  ROS:  A ROS was performed and pertinent positives and negatives are included.  Exam:  Vitals:   08/24/15 1527  BP: 122/80  Weight: 182 lb (82.6 kg)  Height: 5\' 8"  (1.727 m)   Body mass index is 27.67 kg/m.   General appearance:  Normal Thyroid:  Symmetrical, normal in size, without palpable masses or nodularity. Respiratory  Auscultation:  Clear without wheezing or rhonchi Cardiovascular  Auscultation:  Regular rate, without rubs, murmurs or gallops  Edema/varicosities:  Not grossly evident Abdominal  Soft,nontender, without masses, guarding or rebound.  Liver/spleen:  No organomegaly noted  Hernia:  None appreciated  Skin  Inspection:  Grossly normal   Breasts: Examined lying and sitting.     Right: Without masses, retractions, discharge or axillary adenopathy.     Left: Without masses, retractions, discharge or axillary adenopathy. Gentitourinary   Inguinal/mons:  Normal without inguinal adenopathy  External genitalia:  Normal  BUS/Urethra/Skene's glands:  Normal  Vagina:  Atrophic  Cervix:  Normal  Uterus:   normal in size, shape and contour.  Midline and mobile  Adnexa/parametria:     Rt: Without masses or tenderness.   Lt: Without masses or tenderness.  Anus and perineum: Normal  Digital rectal exam: Normal sphincter tone  with small hemorrhoids   Assessment/Plan:  65 y.o. MWF G2 P2 for annual exam.   Postmenopausal/no HRT/no bleeding 2006 CIN-1 1 normal after Osteopenia without elevated FRAX Hypothyroid-endocrinologist manages  Plan: [name], continue annual 3-D screening mammogram, calcium rich diet, vitamin D 1000 daily encouraged. Continue vaginal lubricants with intercourse, pyridium with intercourse as needed. Home safety, fall prevention and importance of weightbearing exercise reviewed. CBC, lipid panel, vitamin D, CMP, UA, Pap normal with negative HR HPV 2015 will repeat next year.   Harrington ChallengerYOUNG,Donna J [facility]WHNP, 3:53 PM 08/24/2015

## 2015-08-24 NOTE — Patient Instructions (Signed)

## 2016-05-17 ENCOUNTER — Encounter: Payer: Self-pay | Admitting: Gynecology

## 2016-06-12 ENCOUNTER — Other Ambulatory Visit: Payer: Self-pay | Admitting: Family Medicine

## 2016-06-12 DIAGNOSIS — Z1231 Encounter for screening mammogram for malignant neoplasm of breast: Secondary | ICD-10-CM

## 2016-07-24 ENCOUNTER — Ambulatory Visit
Admission: RE | Admit: 2016-07-24 | Discharge: 2016-07-24 | Disposition: A | Discharge status: Home / Self Care | Payer: BC Managed Care – PPO | Source: Ambulatory Visit | Attending: Family Medicine | Admitting: Family Medicine

## 2016-07-24 DIAGNOSIS — Z1231 Encounter for screening mammogram for malignant neoplasm of breast: Secondary | ICD-10-CM

## 2016-08-28 ENCOUNTER — Ambulatory Visit (INDEPENDENT_AMBULATORY_CARE_PROVIDER_SITE_OTHER): Payer: Medicare HMO | Admitting: Women's Health

## 2016-08-28 ENCOUNTER — Encounter: Payer: Self-pay | Admitting: Women's Health

## 2016-08-28 VITALS — BP 110/80 | Ht 68.0 in | Wt 196.0 lb

## 2016-08-28 DIAGNOSIS — Z124 Encounter for screening for malignant neoplasm of cervix: Secondary | ICD-10-CM | POA: Diagnosis not present

## 2016-08-28 DIAGNOSIS — Z01419 Encounter for gynecological examination (general) (routine) without abnormal findings: Secondary | ICD-10-CM

## 2016-08-28 NOTE — Progress Notes (Signed)
Donna Haney 05-04-1950 [id number]    History:    Presents for breast and pelvic exam. Normal mammogram history. 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 2017 negative colonoscopy. 2017 T score -1.8 FRAX 13%/1.8%. Has had Zostavax. Primary care managing hypothyroidism. Having a problem with left hip pain which has limited exercise.  Past medical history, past surgical history, family history and social history were all reviewed and documented in the EPIC chart. Retired from 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 Daughter minimal contact history of drug abuse. Good relationship with son, 46-year-old grandson has cerebral palsy.  ROS:  A ROS was performed and pertinent positives and negatives are included.  Exam:  Vitals:   08/28/16 0855  BP: 110/80  Weight: 196 lb (88.9 kg)  Height: 5\' 8"  (1.727 m)   Body mass index is 29.8 kg/m.   General appearance:  Normal Thyroid:  Symmetrical, normal in size, without palpable masses or nodularity. Respiratory  Auscultation:  Clear without wheezing or rhonchi Cardiovascular  Auscultation:  Regular rate, without rubs, murmurs or gallops  Edema/varicosities:  Not grossly evident Abdominal  Soft,nontender, without masses, guarding or rebound.  Liver/spleen:  No organomegaly noted  Hernia:  None appreciated  Skin  Inspection:  Grossly normal   Breasts: Examined lying and sitting.     Right: Without masses, retractions, discharge or axillary adenopathy.     Left: Without masses, retractions, discharge or axillary adenopathy. Gentitourinary   Inguinal/mons:  Normal without inguinal adenopathy  External genitalia:  Normal  BUS/Urethra/Skene's glands:  Normal  Vagina:  Normal  Cervix:  Normal  Uterus: normal in size, shape and contour.  Midline and mobile  Adnexa/parametria:     Rt: Without masses or tenderness.   Lt: Without masses or tenderness.  Anus and perineum: Normal  Digital rectal exam: Normal sphincter  tone without palpated masses or tenderness  Assessment/Plan:  66 y.o. MWF G2 P2 for breast and pelvic exam.  Postmenopausal on no HRT with no bleeding. 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 Reviewed importance of home safety, fall prevention and importance of weightbearing exercise, yoga encouraged. Continue follow-up with orthopedist for left hip pain. Pap normal with negative HR HPV 2015, Pap.    Harrington Challenger [facility], 10:52 AM 08/28/2016

## 2016-08-28 NOTE — Patient Instructions (Signed)
Sciatica Sciatica is pain, numbness, weakness, or tingling along the path of the sciatic nerve. The sciatic nerve starts in the lower back and runs down the back of each leg. The nerve controls the muscles in the lower leg and in the back of the knee. It also provides feeling (sensation) to the back of the thigh, the lower leg, and the sole of the foot. Sciatica is a symptom of another medical condition that pinches or puts pressure on the sciatic nerve. Generally, sciatica only affects one side of the body. Sciatica usually goes away on its own or with treatment. In some cases, sciatica may keep coming back (recur). What are the causes? This condition is caused by pressure on the sciatic nerve, or pinching of the sciatic nerve. This may be the result of:  A disk in between the bones of the spine (vertebrae) bulging out too far (herniated disk).  Age-related changes in the spinal disks (degenerative disk disease).  A pain disorder that affects a muscle in the buttock (piriformis syndrome).  Extra bone growth (bone spur) near the sciatic nerve.  An injury or break (fracture) of the pelvis.  Pregnancy.  Tumor (rare).  What increases the risk? The following factors may make you more likely to develop this condition:  Playing sports that place pressure or stress on the spine, such as football or weight lifting.  Having poor strength and flexibility.  A history of back injury.  A history of back surgery.  Sitting for long periods of time.  Doing activities that involve repetitive bending or lifting.  Obesity.  What are the signs or symptoms? Symptoms can vary from mild to very severe, and they may include:  Any of these problems in the lower back, leg, hip, or buttock: ? Mild tingling or dull aches. ? Burning sensations. ? Sharp pains.  Numbness in the back of the calf or the sole of the foot.  Leg weakness.  Severe back pain that makes movement difficult.  These  symptoms may get worse when you cough, sneeze, or laugh, or when you sit or stand for long periods of time. Being overweight may also make symptoms worse. In some cases, symptoms may recur over time. How is this diagnosed? This condition may be diagnosed based on:  Your symptoms.  A physical exam. Your health care provider may ask you to do certain movements to check whether those movements trigger your symptoms.  You may have tests, including: ? Blood tests. ? X-rays. ? MRI. ? CT scan.  How is this treated? In many cases, this condition improves on its own, without any treatment. However, treatment may include:  Reducing or modifying physical activity during periods of pain.  Exercising and stretching to strengthen your abdomen and improve the flexibility of your spine.  Icing and applying heat to the affected area.  Medicines that help: ? To relieve pain and swelling. ? To relax your muscles.  Injections of medicines that help to relieve pain, irritation, and inflammation around the sciatic nerve (steroids).  Surgery.  Follow these instructions at home: Medicines  Take over-the-counter and prescription medicines only as told by your health care provider.  Do not drive or operate heavy machinery while taking prescription pain medicine. Managing pain  If directed, apply ice to the affected area. ? Put ice in a plastic bag. ? Place a towel between your skin and the bag. ? Leave the ice on for 20 minutes, 2-3 times a day.  After icing, apply  heat to the affected area before you exercise or as often as told by your health care provider. Use the heat source that your health care provider recommends, such as a moist heat pack or a heating pad. ? Place a towel between your skin and the heat source. ? Leave the heat on for 20-30 minutes. ? Remove the heat if your skin turns bright red. This is especially important if you are unable to feel pain, heat, or cold. You may have a  greater risk of getting burned. Activity  Return to your normal activities as told by your health care provider. Ask your health care provider what activities are safe for you. ? Avoid activities that make your symptoms worse.  Take brief periods of rest throughout the day. Resting in a lying or standing position is usually better than sitting to rest. ? When you rest for longer periods, mix in some mild activity or stretching between periods of rest. This will help to prevent stiffness and pain. ? Avoid sitting for long periods of time without moving. Get up and move around at least one time each hour.  Exercise and stretch regularly, as told by your health care provider.  Do not lift anything that is heavier than 10 lb (4.5 kg) while you have symptoms of sciatica. When you do not have symptoms, you should still avoid heavy lifting, especially repetitive heavy lifting.  When you lift objects, always use proper lifting technique, which includes: ? Bending your knees. ? Keeping the load close to your body. ? Avoiding twisting. General instructions  Use good posture. ? Avoid leaning forward while sitting. ? Avoid hunching over while standing.  Maintain a healthy weight. Excess weight puts extra stress on your back and makes it difficult to maintain good posture.  Wear supportive, comfortable shoes. Avoid wearing high heels.  Avoid sleeping on a mattress that is too soft or too hard. A mattress that is firm enough to support your back when you sleep may help to reduce your pain.  Keep all follow-up visits as told by your health care provider. This is important. Contact a health care provider if:  You have pain that wakes you up when you are sleeping.  You have pain that gets worse when you lie down.  Your pain is worse than you have experienced in the past.  Your pain lasts longer than 4 weeks.  You experience unexplained weight loss. Get help right away if:  You lose control  of your bowel or bladder (incontinence).  You have: ? Weakness in your lower back, pelvis, buttocks, or legs that gets worse. ? Redness or swelling of your back. ? A burning sensation when you urinate. This information is not intended to replace advice given to you by your health care provider. Make sure you discuss any questions you have with your health care provider. Document Released: 12/13/2000 Document Revised: 05/25/2015 Document Reviewed: 08/28/2014 Elsevier Interactive Patient Education  2017 [place] Maintenance for Postmenopausal Women Menopause is a normal process in which your reproductive ability comes to an end. This process happens gradually over a span of months to years, usually between the ages of 63 and 78. Menopause is complete when you have missed 12 consecutive menstrual periods. It is important to talk with your health care provider about some of the most common conditions that affect postmenopausal women, such as heart disease, cancer, and bone loss (osteoporosis). Adopting a healthy lifestyle and getting preventive care can help to  promote your health and wellness. Those actions can also lower your chances of developing some of these common conditions. What should I know about menopause? During menopause, you may experience a number of symptoms, such as:  Moderate-to-severe hot flashes.  Night sweats.  Decrease in sex drive.  Mood swings.  Headaches.  Tiredness.  Irritability.  Memory problems.  Insomnia.  Choosing to treat or not to treat menopausal changes is an individual decision that you make with your health care provider. What should I know about hormone replacement therapy and supplements? Hormone therapy products are effective for treating symptoms that are associated with menopause, such as hot flashes and night sweats. Hormone replacement carries certain risks, especially as you become older. If you are thinking about using estrogen  or estrogen with progestin treatments, discuss the benefits and risks with your health care provider. What should I know about heart disease and stroke? Heart disease, heart attack, and stroke become more likely as you age. This may be due, in part, to the hormonal changes that your body experiences during menopause. These can affect how your body processes dietary fats, triglycerides, and cholesterol. Heart attack and stroke are both medical emergencies. There are many things that you can do to help prevent heart disease and stroke:  Have your blood pressure checked at least every 1-2 years. High blood pressure causes heart disease and increases the risk of stroke.  If you are 17-79 years old, ask your health care provider if you should take aspirin to prevent a heart attack or a stroke.  Do not use any tobacco products, including cigarettes, chewing tobacco, or electronic cigarettes. If you need help quitting, ask your health care provider.  It is important to eat a healthy diet and maintain a healthy weight. ? Be sure to include plenty of vegetables, fruits, low-fat dairy products, and lean protein. ? Avoid eating foods that are high in solid fats, added sugars, or salt (sodium).  Get regular exercise. This is one of the most important things that you can do for your health. ? Try to exercise for at least 150 minutes each week. The type of exercise that you do should increase your heart rate and make you sweat. This is known as moderate-intensity exercise. ? Try to do strengthening exercises at least twice each week. Do these in addition to the moderate-intensity exercise.  Know your numbers.Ask your health care provider to check your cholesterol and your blood glucose. Continue to have your blood tested as directed by your health care provider.  What should I know about cancer screening? There are several types of cancer. Take the following steps to reduce your risk and to catch any cancer  development as early as possible. Breast Cancer  Practice breast self-awareness. ? This means understanding how your breasts normally appear and feel. ? It also means doing regular breast self-exams. Let your health care provider know about any changes, no matter how small.  If you are 42 or older, have a clinician do a breast exam (clinical breast exam or CBE) every year. Depending on your age, family history, and medical history, it may be recommended that you also have a yearly breast X-ray (mammogram).  If you have a family history of breast cancer, talk with your health care provider about genetic screening.  If you are at high risk for breast cancer, talk with your health care provider about having an MRI and a mammogram every year.  Breast cancer (BRCA) gene test is  recommended for women who have family members with BRCA-related cancers. Results of the assessment will determine the need for genetic counseling and BRCA1 and for BRCA2 testing. BRCA-related cancers include these types: ? Breast. This occurs in males or females. ? Ovarian. ? Tubal. This may also be called fallopian tube cancer. ? Cancer of the abdominal or pelvic lining (peritoneal cancer). ? Prostate. ? Pancreatic.  Cervical, Uterine, and Ovarian Cancer Your health care provider may recommend that you be screened regularly for cancer of the pelvic organs. These include your ovaries, uterus, and vagina. This screening involves a pelvic exam, which includes checking for microscopic changes to the surface of your cervix (Pap test).  For women ages 21-65, health care providers may recommend a pelvic exam and a Pap test every three years. For women ages 74-65, they may recommend the Pap test and pelvic exam, combined with testing for human papilloma virus (HPV), every five years. Some types of HPV increase your risk of cervical cancer. Testing for HPV may also be done on women of any age who have unclear Pap test  results.  Other health care providers may not recommend any screening for nonpregnant women who are considered low risk for pelvic cancer and have no symptoms. Ask your health care provider if a screening pelvic exam is right for you.  If you have had past treatment for cervical cancer or a condition that could lead to cancer, you need Pap tests and screening for cancer for at least 20 years after your treatment. If Pap tests have been discontinued for you, your risk factors (such as having a new sexual partner) need to be reassessed to determine if you should start having screenings again. Some women have medical problems that increase the chance of getting cervical cancer. In these cases, your health care provider may recommend that you have screening and Pap tests more often.  If you have a family history of uterine cancer or ovarian cancer, talk with your health care provider about genetic screening.  If you have vaginal bleeding after reaching menopause, tell your health care provider.  There are currently no reliable tests available to screen for ovarian cancer.  Lung Cancer Lung cancer screening is recommended for adults 60-29 years old who are at high risk for lung cancer because of a history of smoking. A yearly low-dose CT scan of the lungs is recommended if you:  Currently smoke.  Have a history of at least 30 pack-years of smoking and you currently smoke or have quit within the past 15 years. A pack-year is smoking an average of one pack of cigarettes per day for one year.  Yearly screening should:  Continue until it has been 15 years since you quit.  Stop if you develop a health problem that would prevent you from having lung cancer treatment.  Colorectal Cancer  This type of cancer can be detected and can often be prevented.  Routine colorectal cancer screening usually begins at age 41 and continues through age 91.  If you have risk factors for colon cancer, your health  care provider may recommend that you be screened at an earlier age.  If you have a family history of colorectal cancer, talk with your health care provider about genetic screening.  Your health care provider may also recommend using home test kits to check for hidden blood in your stool.  A small camera at the end of a tube can be used to examine your colon directly (sigmoidoscopy or  colonoscopy). This is done to check for the earliest forms of colorectal cancer.  Direct examination of the colon should be repeated every 5-10 years until age 39. However, if early forms of precancerous polyps or small growths are found or if you have a family history or genetic risk for colorectal cancer, you may need to be screened more often.  Skin Cancer  Check your skin from head to toe regularly.  Monitor any moles. Be sure to tell your health care provider: ? About any new moles or changes in moles, especially if there is a change in a mole's shape or color. ? If you have a mole that is larger than the size of a pencil eraser.  If any of your family members has a history of skin cancer, especially at a Donna Haney age, talk with your health care provider about genetic screening.  Always use sunscreen. Apply sunscreen liberally and repeatedly throughout the day.  Whenever you are outside, protect yourself by wearing long sleeves, pants, a wide-brimmed hat, and sunglasses.  What should I know about osteoporosis? Osteoporosis is a condition in which bone destruction happens more quickly than new bone creation. After menopause, you may be at an increased risk for osteoporosis. To help prevent osteoporosis or the bone fractures that can happen because of osteoporosis, the following is recommended:  If you are 20-30 years old, get at least 1,000 mg of calcium and at least 600 mg of vitamin D per day.  If you are older than age 38 but younger than age 53, get at least 1,200 mg of calcium and at least 600 mg of  vitamin D per day.  If you are older than age 64, get at least 1,200 mg of calcium and at least 800 mg of vitamin D per day.  Smoking and excessive alcohol intake increase the risk of osteoporosis. Eat foods that are rich in calcium and vitamin D, and do weight-bearing exercises several times each week as directed by your health care provider. What should I know about how menopause affects my mental health? Depression may occur at any age, but it is more common as you become older. Common symptoms of depression include:  Low or sad mood.  Changes in sleep patterns.  Changes in appetite or eating patterns.  Feeling an overall lack of motivation or enjoyment of activities that you previously enjoyed.  Frequent crying spells.  Talk with your health care provider if you think that you are experiencing depression. What should I know about immunizations? It is important that you get and maintain your immunizations. These include:  Tetanus, diphtheria, and pertussis (Tdap) booster vaccine.  Influenza every year before the flu season begins.  Pneumonia vaccine.  Shingles vaccine.  Your health care provider may also recommend other immunizations. This information is not intended to replace advice given to you by your health care provider. Make sure you discuss any questions you have with your health care provider. Document Released: 02/10/2005 Document Revised: 07/09/2015 Document Reviewed: 09/22/2014 Elsevier Interactive Patient Education  2018 [employer].

## 2016-08-30 LAB — PAP IG W/ RFLX HPV ASCU

## 2017-06-15 ENCOUNTER — Other Ambulatory Visit: Payer: Self-pay | Admitting: Family Medicine

## 2017-06-15 DIAGNOSIS — Z1231 Encounter for screening mammogram for malignant neoplasm of breast: Secondary | ICD-10-CM

## 2017-06-28 ENCOUNTER — Telehealth: Payer: Self-pay | Admitting: *Deleted

## 2017-06-28 DIAGNOSIS — R350 Frequency of micturition: Secondary | ICD-10-CM

## 2017-06-28 NOTE — Telephone Encounter (Signed)
Ok for refill, pyridium 100mg  I think is OTC, the 200mg  is rx. review best to urinate after intercourse and be sure to drink plenty of water esp in this HEAT!!!! Have fun and wear sunscreen

## 2017-06-28 NOTE — Telephone Encounter (Signed)
(  pt aware you are out of the office) Patient called requesting refill on Pyridium 100 mg tablet to take 1 po as needed after intercourse, leaving for vacation on Saturday and would like to have Rx to take with her. Please advise

## 2017-06-29 NOTE — Telephone Encounter (Signed)
Pt informed will pick up Rx OTC.

## 2017-07-31 ENCOUNTER — Ambulatory Visit
Admission: RE | Admit: 2017-07-31 | Discharge: 2017-07-31 | Disposition: A | Discharge status: Home / Self Care | Payer: Medicare HMO | Source: Ambulatory Visit | Attending: Family Medicine | Admitting: Family Medicine

## 2017-07-31 DIAGNOSIS — Z1231 Encounter for screening mammogram for malignant neoplasm of breast: Secondary | ICD-10-CM

## 2017-08-01 ENCOUNTER — Other Ambulatory Visit: Payer: Self-pay | Admitting: Family Medicine

## 2017-08-01 DIAGNOSIS — R928 Other abnormal and inconclusive findings on diagnostic imaging of breast: Secondary | ICD-10-CM

## 2017-08-03 ENCOUNTER — Ambulatory Visit
Admission: RE | Admit: 2017-08-03 | Discharge: 2017-08-03 | Disposition: A | Discharge status: Home / Self Care | Payer: BC Managed Care – PPO | Source: Ambulatory Visit | Attending: Family Medicine | Admitting: Family Medicine

## 2017-08-03 DIAGNOSIS — R928 Other abnormal and inconclusive findings on diagnostic imaging of breast: Secondary | ICD-10-CM

## 2017-08-29 ENCOUNTER — Ambulatory Visit (INDEPENDENT_AMBULATORY_CARE_PROVIDER_SITE_OTHER): Payer: Medicare HMO | Admitting: Women's Health

## 2017-08-29 ENCOUNTER — Encounter: Payer: Self-pay | Admitting: Women's Health

## 2017-08-29 VITALS — BP 118/80 | Ht 68.0 in | Wt 201.0 lb

## 2017-08-29 DIAGNOSIS — Z01419 Encounter for gynecological examination (general) (routine) without abnormal findings: Secondary | ICD-10-CM | POA: Diagnosis not present

## 2017-08-29 DIAGNOSIS — Z13820 Encounter for screening for osteoporosis: Secondary | ICD-10-CM

## 2017-08-29 DIAGNOSIS — E2839 Other primary ovarian failure: Secondary | ICD-10-CM

## 2017-08-29 MED ORDER — NITROFURANTOIN MACROCRYSTAL 50 MG PO CAPS: ORAL_CAPSULE | ORAL | 0 refills | Status: DC

## 2017-08-29 NOTE — Progress Notes (Signed)
Donna EvenerMarjorie W Haney August 08, 1950 [id number]    History:    Presents for breast and pelvic exam.  Postmenopausal on no HRT with no bleeding.  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  Normal mammogram history, 2019 normal after ultrasound.  2017 T score -1.8 FRAX 13% / 1.8%.  Primary care manages hypothyroidism, hypercholesteremia and celiac disease.  Struggling with fatigue has follow-up for a sleep study.  Macrodantin 50 mg after intercourse, no UTIs.  Past medical history, past surgical history, family history and social history were all reviewed and documented in the EPIC chart.  [place]ired from [employer].  Cleans houses several days a week.  66-year-old grandson with cerebral palsy doing better.  Daughter history of drug abuse, minimal contact.  ROS:  A ROS was performed and pertinent positives and negatives are included.  Exam:  Vitals:   08/29/17 0810  BP: 118/80  Weight: 201 lb (91.2 kg)  Height: 5\' 8"  (1.727 m)   Body mass index is 30.56 kg/m.   General appearance:  Normal Thyroid:  Symmetrical, normal in size, without palpable masses or nodularity. Respiratory  Auscultation:  Clear without wheezing or rhonchi Cardiovascular  Auscultation:  Regular rate, without rubs, murmurs or gallops  Edema/varicosities:  Not grossly evident Abdominal  Soft,nontender, without masses, guarding or rebound.  Liver/spleen:  No organomegaly noted  Hernia:  None appreciated  Skin  Inspection:  Grossly normal   Breasts: Examined lying and sitting.     Right: Without masses, retractions, discharge or axillary adenopathy.     Left: Without masses, retractions, discharge or axillary adenopathy. Gentitourinary   Inguinal/mons:  Normal without inguinal adenopathy  External genitalia:  Normal  BUS/Urethra/Skene's glands:  Normal  Vagina:  Normal  Cervix:  Normal  Uterus:   normal in size, shape and contour.  Midline and mobile  Adnexa/parametria:     Rt: Without masses or  tenderness.   Lt: Without masses or tenderness.  Anus and perineum: Normal  Digital rectal exam: Normal sphincter tone without palpated masses or tenderness  Assessment/Plan:  67 y.o. MWF G2, P2 for breast and pelvic exam with no GYN complaints.  Postmenopausal/no HRT/no bleeding Hypothyroidism/hypercholesteremia-primary care manages labs and meds Osteopenia without elevated FRAX Fatigue- keep scheduled follow-up for sleep study  Plan: Macrodantin 50 mg p.o. after intercourse prescription, proper use given and reviewed, has rare intercourse.  UTI prevention discussed.  [name], continue annual 3D screening mammogram, calcium rich foods, vitamin D 2000 daily encouraged.  Repeat DEXA.  Home safety, fall prevention and importance of weightbearing/balance exercise reviewed.  Pap normal with negative HR HPV 2016, new screening guidelines reviewed.   Harrington Challengerancy J Shondell Haney [facility] CenterWHNP, 9:12 AM 08/29/2017

## 2017-08-29 NOTE — Patient Instructions (Signed)
Health Maintenance for Postmenopausal Women Menopause is a normal process in which your reproductive ability comes to an end. This process happens gradually over a span of months to years, usually between the ages of 22 and 9. Menopause is complete when you have missed 12 consecutive menstrual periods. It is important to talk with your health care provider about some of the most common conditions that affect postmenopausal women, such as heart disease, cancer, and bone loss (osteoporosis). Adopting a healthy lifestyle and getting preventive care can help to promote your health and wellness. Those actions can also lower your chances of developing some of these common conditions. What should I know about menopause? During menopause, you may experience a number of symptoms, such as:  Moderate-to-severe hot flashes.  Night sweats.  Decrease in sex drive.  Mood swings.  Headaches.  Tiredness.  Irritability.  Memory problems.  Insomnia.  Choosing to treat or not to treat menopausal changes is an individual decision that you make with your health care provider. What should I know about hormone replacement therapy and supplements? Hormone therapy products are effective for treating symptoms that are associated with menopause, such as hot flashes and night sweats. Hormone replacement carries certain risks, especially as you become older. If you are thinking about using estrogen or estrogen with progestin treatments, discuss the benefits and risks with your health care provider. What should I know about heart disease and stroke? Heart disease, heart attack, and stroke become more likely as you age. This may be due, in part, to the hormonal changes that your body experiences during menopause. These can affect how your body processes dietary fats, triglycerides, and cholesterol. Heart attack and stroke are both medical emergencies. There are many things that you can do to help prevent heart disease  and stroke:  Have your blood pressure checked at least every 1-2 years. High blood pressure causes heart disease and increases the risk of stroke.  If you are 53-22 years old, ask your health care provider if you should take aspirin to prevent a heart attack or a stroke.  Do not use any tobacco products, including cigarettes, chewing tobacco, or electronic cigarettes. If you need help quitting, ask your health care provider.  It is important to eat a healthy diet and maintain a healthy weight. ? Be sure to include plenty of vegetables, fruits, low-fat dairy products, and lean protein. ? Avoid eating foods that are high in solid fats, added sugars, or salt (sodium).  Get regular exercise. This is one of the most important things that you can do for your health. ? Try to exercise for at least 150 minutes each week. The type of exercise that you do should increase your heart rate and make you sweat. This is known as moderate-intensity exercise. ? Try to do strengthening exercises at least twice each week. Do these in addition to the moderate-intensity exercise.  Know your numbers.Ask your health care provider to check your cholesterol and your blood glucose. Continue to have your blood tested as directed by your health care provider.  What should I know about cancer screening? There are several types of cancer. Take the following steps to reduce your risk and to catch any cancer development as early as possible. Breast Cancer  Practice breast self-awareness. ? This means understanding how your breasts normally appear and feel. ? It also means doing regular breast self-exams. Let your health care provider know about any changes, no matter how small.  If you are 40  or older, have a clinician do a breast exam (clinical breast exam or CBE) every year. Depending on your age, family history, and medical history, it may be recommended that you also have a yearly breast X-ray (mammogram).  If you  have a family history of breast cancer, talk with your health care provider about genetic screening.  If you are at high risk for breast cancer, talk with your health care provider about having an MRI and a mammogram every year.  Breast cancer (BRCA) gene test is recommended for women who have family members with BRCA-related cancers. Results of the assessment will determine the need for genetic counseling and BRCA1 and for BRCA2 testing. BRCA-related cancers include these types: ? Breast. This occurs in males or females. ? Ovarian. ? Tubal. This may also be called fallopian tube cancer. ? Cancer of the abdominal or pelvic lining (peritoneal cancer). ? Prostate. ? Pancreatic.  Cervical, Uterine, and Ovarian Cancer Your health care provider may recommend that you be screened regularly for cancer of the pelvic organs. These include your ovaries, uterus, and vagina. This screening involves a pelvic exam, which includes checking for microscopic changes to the surface of your cervix (Pap test).  For women ages 21-65, health care providers may recommend a pelvic exam and a Pap test every three years. For women ages 79-65, they may recommend the Pap test and pelvic exam, combined with testing for human papilloma virus (HPV), every five years. Some types of HPV increase your risk of cervical cancer. Testing for HPV may also be done on women of any age who have unclear Pap test results.  Other health care providers may not recommend any screening for nonpregnant women who are considered low risk for pelvic cancer and have no symptoms. Ask your health care provider if a screening pelvic exam is right for you.  If you have had past treatment for cervical cancer or a condition that could lead to cancer, you need Pap tests and screening for cancer for at least 20 years after your treatment. If Pap tests have been discontinued for you, your risk factors (such as having a new sexual partner) need to be  reassessed to determine if you should start having screenings again. Some women have medical problems that increase the chance of getting cervical cancer. In these cases, your health care provider may recommend that you have screening and Pap tests more often.  If you have a family history of uterine cancer or ovarian cancer, talk with your health care provider about genetic screening.  If you have vaginal bleeding after reaching menopause, tell your health care provider.  There are currently no reliable tests available to screen for ovarian cancer.  Lung Cancer Lung cancer screening is recommended for adults 69-62 years old who are at high risk for lung cancer because of a history of smoking. A yearly low-dose CT scan of the lungs is recommended if you:  Currently smoke.  Have a history of at least 30 pack-years of smoking and you currently smoke or have quit within the past 15 years. A pack-year is smoking an average of one pack of cigarettes per day for one year.  Yearly screening should:  Continue until it has been 15 years since you quit.  Stop if you develop a health problem that would prevent you from having lung cancer treatment.  Colorectal Cancer  This type of cancer can be detected and can often be prevented.  Routine colorectal cancer screening usually begins at  age 42 and continues through age 45.  If you have risk factors for colon cancer, your health care provider may recommend that you be screened at an earlier age.  If you have a family history of colorectal cancer, talk with your health care provider about genetic screening.  Your health care provider may also recommend using home test kits to check for hidden blood in your stool.  A small camera at the end of a tube can be used to examine your colon directly (sigmoidoscopy or colonoscopy). This is done to check for the earliest forms of colorectal cancer.  Direct examination of the colon should be repeated every  5-10 years until age 71. However, if early forms of precancerous polyps or small growths are found or if you have a family history or genetic risk for colorectal cancer, you may need to be screened more often.  Skin Cancer  Check your skin from head to toe regularly.  Monitor any moles. Be sure to tell your health care provider: ? About any new moles or changes in moles, especially if there is a change in a mole's shape or color. ? If you have a mole that is larger than the size of a pencil eraser.  If any of your family members has a history of skin cancer, especially at a Maliek Schellhorn age, talk with your health care provider about genetic screening.  Always use sunscreen. Apply sunscreen liberally and repeatedly throughout the day.  Whenever you are outside, protect yourself by wearing long sleeves, pants, a wide-brimmed hat, and sunglasses.  What should I know about osteoporosis? Osteoporosis is a condition in which bone destruction happens more quickly than new bone creation. After menopause, you may be at an increased risk for osteoporosis. To help prevent osteoporosis or the bone fractures that can happen because of osteoporosis, the following is recommended:  If you are 46-71 years old, get at least 1,000 mg of calcium and at least 600 mg of vitamin D per day.  If you are older than age 55 but younger than age 65, get at least 1,200 mg of calcium and at least 600 mg of vitamin D per day.  If you are older than age 54, get at least 1,200 mg of calcium and at least 800 mg of vitamin D per day.  Smoking and excessive alcohol intake increase the risk of osteoporosis. Eat foods that are rich in calcium and vitamin D, and do weight-bearing exercises several times each week as directed by your health care provider. What should I know about how menopause affects my mental health? Depression may occur at any age, but it is more common as you become older. Common symptoms of depression  include:  Low or sad mood.  Changes in sleep patterns.  Changes in appetite or eating patterns.  Feeling an overall lack of motivation or enjoyment of activities that you previously enjoyed.  Frequent crying spells.  Talk with your health care provider if you think that you are experiencing depression. What should I know about immunizations? It is important that you get and maintain your immunizations. These include:  Tetanus, diphtheria, and pertussis (Tdap) booster vaccine.  Influenza every year before the flu season begins.  Pneumonia vaccine.  Shingles vaccine.  Your health care provider may also recommend other immunizations. This information is not intended to replace advice given to you by your health care provider. Make sure you discuss any questions you have with your health care provider. Document Released: 02/10/2005  Document Revised: 07/09/2015 Document Reviewed: 09/22/2014 Elsevier Interactive Patient Education  2018 [employer]vier [employer]  

## 2017-09-06 ENCOUNTER — Ambulatory Visit (INDEPENDENT_AMBULATORY_CARE_PROVIDER_SITE_OTHER): Payer: Medicare HMO | Admitting: Pulmonary Disease

## 2017-09-06 ENCOUNTER — Encounter: Payer: Self-pay | Admitting: Pulmonary Disease

## 2017-09-06 VITALS — BP 118/72 | HR 75 | Ht 68.0 in | Wt 198.0 lb

## 2017-09-06 DIAGNOSIS — J3089 Other allergic rhinitis: Secondary | ICD-10-CM

## 2017-09-06 DIAGNOSIS — R0683 Snoring: Secondary | ICD-10-CM | POA: Diagnosis not present

## 2017-09-06 NOTE — Progress Notes (Signed)
[facility]  Chief Complaint  Patient presents with  . Sleep Consult    Referred by Dr. Benedetto Goad at [facility] for possible OSA. Patient reports that she gasps for air at night. Her husband has also reported that she snores during the night. She does not wake up feeling refreshed in the mornings. Denies ever having a SS before.     Constitutional: BP 118/72 (BP Location: Left Arm, Patient Position: Sitting, Cuff Size: Normal)   Pulse 75   Ht 5\' 8"  (1.727 m)   Wt 198 lb (89.8 kg)   SpO2 98%   BMI 30.11 kg/m   History of Present Illness: Donna Haney is a 67 y.o. female with snoring.  She remarried about 3 years ago.  Her husband says she snores and stops breathing at night.  She feels like her throat closes when she lays flat.  She doesn't dream much.  She never feels like she gets enough sleep.  She was seen by ENT several years ago and told she didn't have enlarged tonsils, and therefore didn't need to have a sleep study.  She goes to sleep at 10 pm.  She falls asleep in 45 minutes.  She wakes up 2 or 3 times to use the bathroom.  She gets out of bed at 7 am.  She feels tired in the morning.  She denies morning headache.  She does not use anything to help her fall sleep or stay awake.  She denies sleep walking, sleep talking, bruxism, or nightmares.  There is no history of restless legs.  She denies sleep hallucinations, sleep paralysis, or cataplexy.  The Epworth score is 8 out of 24.  She gets sinus congestion at night and in the morning.  Comprehensive Respiratory Exam:  Appearance - well kempt  ENMT - nasal mucosa moist, turbinates clear, midline nasal septum, no dental lesions, no gingival bleeding, no oral exudates, no tonsillar hypertrophy, over bite, scalloped tongue, MP 4 Neck - no masses, trachea midline, no thyromegaly, no elevation in JVP Respiratory - normal appearance of chest wall, normal respiratory  effort w/o accessory muscle use, no dullness on percussion, no wheezing or rales CV - s1s2 regular rate and rhythm, no murmurs, no peripheral edema, no varicosities, radial pulses symmetric GI - soft, non tender, no masses, no hepatosplenomegaly Lymph - no adenopathy noted in neck and axillary areas MSK - normal muscle strength and tone, normal gait Ext - no cyanosis, clubbing, or joint inflammation noted Skin - no rashes, lesions, or ulcers Neuro - oriented to person, place, and time Psych - normal mood and affect  Discussion: She has snoring, sleep disruption, daytime sleepiness, and witnessed apnea.  She has family history of sleep apnea.  I am concerned she could have sleep apnea.  Assessment/Plan:  Snoring with excessive daytime sleepiness. - will need to arrange for a home sleep study  Obesity. - discussed how weight can impact sleep and risk for sleep disordered breathing - discussed options to assist with weight loss: combination of diet modification, cardiovascular and strength training exercises  Cardiovascular risk. - had an extensive discussion regarding the adverse health consequences related to untreated sleep disordered breathing - specifically discussed the risks for hypertension, coronary artery disease, cardiac dysrhythmias, cerebrovascular disease, and diabetes - lifestyle modification discussed  Safe driving practices. - discussed how sleep disruption can increase risk of accidents, particularly when driving - safe driving practices were discussed  Therapies for obstructive sleep  apnea. - if the sleep study shows significant sleep apnea, then various therapies for treatment were reviewed: CPAP, oral appliance, and surgical interventions  Nocturnal nasal congestion. - could be related to dust mite exposure - discussed environmental control techniques - advised her to wash her bed sheets in hot water   Patient Instructions  Ask your dentist about whether you  can use a mandibular advancement device if you have sleep apnea Try using covers for your pillows and mattress to limit dust mite exposure at night Will arrange for home sleep study Will call to arrange for follow up after sleep study reviewed     Coralyn Helling, MD [facility] 09/06/2017, 9:49 AM  Flow Sheet  Sleep tests:  Review of Systems: Constitutional: Positive for unexpected weight change. Negative for fever.  HENT: Positive for congestion. Negative for dental problem, ear pain, nosebleeds, postnasal drip, rhinorrhea, sinus pressure, sneezing, sore throat and trouble swallowing.   Eyes: Negative for redness and itching.  Respiratory: Negative for cough, chest tightness, shortness of breath and wheezing.   Cardiovascular: Negative for palpitations and leg swelling.  Gastrointestinal: Negative for nausea and vomiting.  Genitourinary: Negative for dysuria.  Musculoskeletal: Negative for joint swelling.  Skin: Negative for rash.  Allergic/Immunologic: Negative.  Negative for environmental allergies, food allergies and immunocompromised state.  Neurological: Negative for headaches.  Hematological: Does not bruise/bleed easily.  Psychiatric/Behavioral: Negative for dysphoric mood. The patient is not nervous/anxious.    Past Medical History: She  has a past medical history of Celiac disease, CIN I (cervical intraepithelial neoplasia I), H/O: vasectomy, Hemorrhoid, and Osteopenia.  Past Surgical History: She  has a past surgical history that includes BROKEN LEG; Cosmetic surgery; RADIATION OF THYROID; and Colposcopy.  Family History: Her family history is not on file.  Social History: She  reports that she has quit smoking. Her smoking use included cigarettes. She smoked 0.50 packs per day. She has never used smokeless tobacco. She reports that she drinks about 2.0 standard drinks of alcohol per week. She reports that she does not use  drugs.  Medications: Allergies as of 09/06/2017      Reactions   Gluten       Medication List        Accurate as of 09/06/17  9:49 AM. Always use your most recent med list.          atorvastatin 10 MG tablet Commonly known as:  LIPITOR Take 10 mg by mouth daily.   CALCIUM + D PO Take by mouth.   multivitamin capsule Take 1 capsule by mouth daily.   nitrofurantoin 50 MG capsule Commonly known as:  MACRODANTIN Take one after intercourse   SYNTHROID 112 MCG tablet Generic drug:  levothyroxine Take 1 tablet by mouth daily.

## 2017-09-06 NOTE — Patient Instructions (Addendum)
Ask your dentist about whether you can use a mandibular advancement device if you have sleep apnea Try using covers for your pillows and mattress to limit dust mite exposure at night Will arrange for home sleep study Will call to arrange for follow up after sleep study reviewed

## 2017-09-06 NOTE — Progress Notes (Signed)
   Subjective:    Patient ID: Donna Haney, female    DOB: February 03, 1950, 67 y.o.   MRN: [id number]  HPI    Review of Systems  Constitutional: Positive for unexpected weight change. Negative for fever.  HENT: Positive for congestion. Negative for dental problem, ear pain, nosebleeds, postnasal drip, rhinorrhea, sinus pressure, sneezing, sore throat and trouble swallowing.   Eyes: Negative for redness and itching.  Respiratory: Negative for cough, chest tightness, shortness of breath and wheezing.   Cardiovascular: Negative for palpitations and leg swelling.  Gastrointestinal: Negative for nausea and vomiting.  Genitourinary: Negative for dysuria.  Musculoskeletal: Negative for joint swelling.  Skin: Negative for rash.  Allergic/Immunologic: Negative.  Negative for environmental allergies, food allergies and immunocompromised state.  Neurological: Negative for headaches.  Hematological: Does not bruise/bleed easily.  Psychiatric/Behavioral: Negative for dysphoric mood. The patient is not nervous/anxious.        Objective:   Physical Exam        Assessment & Plan:

## 2017-09-14 ENCOUNTER — Telehealth: Payer: Self-pay

## 2017-09-14 DIAGNOSIS — G4733 Obstructive sleep apnea (adult) (pediatric): Secondary | ICD-10-CM

## 2017-09-14 NOTE — Telephone Encounter (Signed)
-----   Message from Tobe SosSally E Ottinger sent at 09/14/2017 10:00 AM EDT ----- Pt's [employer] ok with home sleep study however [employer] denied the home study I need an order for split night put in thanks

## 2017-09-14 NOTE — Telephone Encounter (Signed)
Order placed today for split night sleep study in lab due to [employer] denying HST Nothing further needed.

## 2017-10-02 DIAGNOSIS — M8580 Other specified disorders of bone density and structure, unspecified site: Secondary | ICD-10-CM

## 2017-10-02 HISTORY — DX: Other specified disorders of bone density and structure, unspecified site: M85.80

## 2017-10-08 ENCOUNTER — Other Ambulatory Visit: Payer: Self-pay | Admitting: Gynecology

## 2017-10-08 ENCOUNTER — Ambulatory Visit (INDEPENDENT_AMBULATORY_CARE_PROVIDER_SITE_OTHER): Payer: Medicare HMO

## 2017-10-08 DIAGNOSIS — Z780 Asymptomatic menopausal state: Secondary | ICD-10-CM

## 2017-10-08 DIAGNOSIS — E2839 Other primary ovarian failure: Secondary | ICD-10-CM

## 2017-10-08 DIAGNOSIS — M8589 Other specified disorders of bone density and structure, multiple sites: Secondary | ICD-10-CM | POA: Diagnosis not present

## 2017-10-08 DIAGNOSIS — Z13820 Encounter for screening for osteoporosis: Secondary | ICD-10-CM

## 2017-10-16 ENCOUNTER — Ambulatory Visit (HOSPITAL_BASED_OUTPATIENT_CLINIC_OR_DEPARTMENT_OTHER): Payer: Medicare HMO | Attending: Pulmonary Disease | Admitting: Pulmonary Disease

## 2017-10-16 ENCOUNTER — Encounter: Payer: Self-pay | Admitting: Gynecology

## 2017-10-16 VITALS — Ht 68.0 in | Wt 203.0 lb

## 2017-10-16 DIAGNOSIS — I493 Ventricular premature depolarization: Secondary | ICD-10-CM | POA: Insufficient documentation

## 2017-10-16 DIAGNOSIS — R5383 Other fatigue: Secondary | ICD-10-CM | POA: Diagnosis not present

## 2017-10-16 DIAGNOSIS — R0683 Snoring: Secondary | ICD-10-CM | POA: Insufficient documentation

## 2017-10-16 DIAGNOSIS — G4733 Obstructive sleep apnea (adult) (pediatric): Secondary | ICD-10-CM

## 2017-10-16 DIAGNOSIS — Z79899 Other long term (current) drug therapy: Secondary | ICD-10-CM | POA: Diagnosis not present

## 2017-10-18 ENCOUNTER — Telehealth: Payer: Self-pay | Admitting: Pulmonary Disease

## 2017-10-18 DIAGNOSIS — G4733 Obstructive sleep apnea (adult) (pediatric): Secondary | ICD-10-CM | POA: Diagnosis not present

## 2017-10-18 NOTE — Telephone Encounter (Signed)
Attempted to call patient today regarding results. I did not receive an answer at time of call. I have left a voicemail message for pt to return call. X1  

## 2017-10-18 NOTE — Telephone Encounter (Signed)
PSG 10/16/17 >> AHI 1.8, SpO2 low 88%.   Please schedule ROV with me to discuss sleep study findings in more detail.

## 2017-10-18 NOTE — Procedures (Signed)
   Patient Name: Blimy, Napoleon Date: 10/16/2017   Gender: Female  D.O.B: 1950/11/26  Age (years): 61  Referring Provider: Coralyn Helling MD, ABSM  Height (inches): 68  Interpreting Physician: Coralyn Helling MD, ABSM  Weight (lbs): 203  RPSGT: Ulyess Mort  BMI: 31  MRN: [id number]  Neck Size: 14.00  <br> <br>  CLINICAL INFORMATION  Sleep Study Type: NPSG Indication for sleep study: Fatigue, Snoring Epworth Sleepiness Score: 10 SLEEP STUDY TECHNIQUE  As per the AASM Manual for the Scoring of Sleep and Associated Events v2.3 (April 2016) with a hypopnea requiring 4% desaturations. The channels recorded and monitored were frontal, central and occipital EEG, electrooculogram (EOG), submentalis EMG (chin), nasal and oral airflow, thoracic and abdominal wall motion, anterior tibialis EMG, snore microphone, electrocardiogram, and pulse oximetry. MEDICATIONS  Medications self-administered by patient taken the night of the study : LIPITOR, CALICUM CARBONATE-VITAMIN D, ONE A DAY 50 VITAMIN SLEEP ARCHITECTURE  The study was initiated at 11:00:53 PM and ended at 5:00:56 AM. Sleep onset time was 4.2 minutes and the sleep efficiency was 95.1%%. The total sleep time was 342.5 minutes. Stage REM latency was 60.5 minutes. The patient spent 6.1%% of the night in stage N1 sleep, 72.7%% in stage N2 sleep, 0.0%% in stage N3 and 21.2% in REM. Alpha intrusion was absent. Supine sleep was 17.96%. RESPIRATORY PARAMETERS  The overall apnea/hypopnea index (AHI) was 1.8 per hour. There were 2 total apneas, including 0 obstructive, 2 central and 0 mixed apneas. There were 8 hypopneas and 22 RERAs. The AHI during Stage REM sleep was 0.0 per hour. AHI while supine was 2.9 per hour. The mean oxygen saturation was 94.1%. The minimum SpO2 during sleep was 88.0%. soft snoring was noted during this study. CARDIAC DATA  The 2 lead EKG demonstrated sinus rhythm. The mean heart rate was 60.0 beats per  minute. Other EKG findings include: PVCs.  LEG MOVEMENT DATA  The total PLMS were 0 with a resulting PLMS index of 0.0. Associated arousal with leg movement index was 0.5 . IMPRESSIONS  - While she had several respiratory events, these were not frequent enough to qualify for a diagnosis of obstrucive sleep apnea. Her overall AHI was 1.8 with an SpO2 low of 88%. - Mild snoring was noted. DIAGNOSIS  - Snoring. RECOMMENDATIONS  - Avoid alcohol, sedatives and other CNS depressants that may worsen sleep apnea and disrupt normal sleep architecture.  - Sleep hygiene should be reviewed to assess factors that may improve sleep quality.  - Weight management and regular exercise should be initiated or continued if appropriate. [Electronically signed] 10/18/2017 11:46 AM Coralyn Helling MD, ABSM  Diplomate, American Board of Sleep Medicine  NPI: [id number]

## 2017-10-19 NOTE — Telephone Encounter (Signed)
Pt returning call and can be reached @ [phone].Caren Griffins

## 2017-10-19 NOTE — Telephone Encounter (Signed)
Spoke with pt and notified of results per Dr. Craige Cotta. Pt verbalized understanding and denied any questions. OV with VS for 10/22/17

## 2017-10-22 ENCOUNTER — Ambulatory Visit: Payer: BC Managed Care – PPO | Admitting: Pulmonary Disease

## 2017-10-25 ENCOUNTER — Encounter: Payer: Self-pay | Admitting: Pulmonary Disease

## 2017-10-25 ENCOUNTER — Ambulatory Visit (INDEPENDENT_AMBULATORY_CARE_PROVIDER_SITE_OTHER): Payer: Medicare HMO | Admitting: Pulmonary Disease

## 2017-10-25 VITALS — BP 127/83 | Ht 68.0 in | Wt 199.0 lb

## 2017-10-25 DIAGNOSIS — R0683 Snoring: Secondary | ICD-10-CM | POA: Diagnosis not present

## 2017-10-25 NOTE — Progress Notes (Signed)
[facility]  Chief Complaint  Patient presents with  . Follow-up    Discuss sleep study results    Constitutional:  BP 127/83 (BP Location: Right Arm, Patient Position: Sitting, Cuff Size: Normal)   Ht 5\' 8"  (1.727 m)   Wt 199 lb (90.3 kg)   SpO2 95%   BMI 30.26 kg/m   Past Medical History:  Celiac disease, Osteopenia, Hemorrhoids, HLD, Hypothyroidism  Brief Summary:  Donna Haney is a 67 y.o. female with snoring.  She is here to review her sleep study. Showed few respiratory events, but not enough to qualify for dx of sleep apnea.  Her main issue is that her snoring bothers her husband.  Physical Exam:   Appearance - well kempt   ENMT - clear nasal mucosa, midline nasal  septum, no oral exudates, no LAN, trachea midline  Respiratory - normal chest wall, normal respiratory effort, no accessory muscle use, no wheeze/rales  CV - s1s2 regular rate and rhythm, no murmurs, no peripheral edema, radial pulses symmetric  GI - soft, non tender, no masses  Lymph - no adenopathy noted in neck and axillary areas  MSK - normal gait  Ext - no cyanosis, clubbing, or joint inflammation noted  Skin - no rashes, lesions, or ulcers  Neuro - normal strength, oriented x 3  Psych - normal mood and affect  Discussion:  Had extensive discussion with her about options to help alleviate snoring.  These included positional therapy, weight loss, oral appliance, theravent, nasal strips, adjustable beds, and specialty pillows.  I cautioned her about avoid excess expense that could be incurred with these options.  Also explained that snoring by itself is not necessary putting her at increased health risk, so pursuing options to control her snoring would be more to alleviate disrupted sleep for her husband.  Also discussed the option of sleeping in separate room from her husband.  Assessment/Plan:   She can call to schedule follow up with  pulmonary/sleep medicine if her sleep issues progress   Patient Instructions  You can contact Dr. Althea Grimmer or Dr. Irene Limbo if you like to assess for a mandibular advancement device to help alleviate snoring  Call to schedule a follow up if your sleep gets worse    Coralyn Helling, MD [facility] Pager: [phone] 10/25/2017, 12:21 PM  Flow Sheet      Sleep tests:  PSG 10/16/17 >> AHI 1.8, SpO2 low 88%.  Medications:   Allergies as of 10/25/2017      Reactions   Gluten       Medication List        Accurate as of 10/25/17 12:21 PM. Always use your most recent med list.          atorvastatin 10 MG tablet Commonly known as:  LIPITOR Take 10 mg by mouth daily.   CALCIUM + D PO Take by mouth.   levothyroxine 125 MCG tablet Commonly known as:  SYNTHROID, LEVOTHROID Take 125 mcg by mouth daily before breakfast.   multivitamin capsule Take 1 capsule by mouth daily.   nitrofurantoin 50 MG capsule Commonly known as:  MACRODANTIN Take one after intercourse       Past Surgical History:  She  has a past surgical history that includes BROKEN LEG; Cosmetic surgery; RADIATION OF THYROID; and Colposcopy.  Family History:  Her family history is not on file.  Social History:  She  reports that she has quit smoking. Her  smoking use included cigarettes. She smoked 0.50 packs per day. She has never used smokeless tobacco. She reports that she drinks about 2.0 standard drinks of alcohol per week. She reports that she does not use drugs.

## 2017-10-25 NOTE — Patient Instructions (Signed)
You can contact Dr. Althea Grimmer or Dr. Irene Limbo if you like to assess for a mandibular advancement device to help alleviate snoring  Call to schedule a follow up if your sleep gets worse

## 2018-03-14 ENCOUNTER — Encounter: Payer: Self-pay | Admitting: Gynecology

## 2018-03-14 ENCOUNTER — Ambulatory Visit (INDEPENDENT_AMBULATORY_CARE_PROVIDER_SITE_OTHER): Payer: Medicare HMO | Admitting: Gynecology

## 2018-03-14 ENCOUNTER — Other Ambulatory Visit: Payer: Self-pay

## 2018-03-14 VITALS — BP 124/82

## 2018-03-14 DIAGNOSIS — N3000 Acute cystitis without hematuria: Secondary | ICD-10-CM | POA: Diagnosis not present

## 2018-03-14 DIAGNOSIS — R300 Dysuria: Secondary | ICD-10-CM | POA: Diagnosis not present

## 2018-03-14 MED ORDER — NITROFURANTOIN MACROCRYSTAL 50 MG PO CAPS: 50.0000 mg | ORAL_CAPSULE | Freq: Once | ORAL | 0 refills | Status: AC

## 2018-03-14 MED ORDER — NITROFURANTOIN MONOHYD MACRO 100 MG PO CAPS: 100.0000 mg | ORAL_CAPSULE | Freq: Two times a day (BID) | ORAL | 0 refills | Status: DC

## 2018-03-14 NOTE — Progress Notes (Signed)
    Donna Haney Jun 24, 1950 [id number]        68 y.o.  [id number] presents complaining of urgency, frequency and mild dysuria over the past month that comes and goes.  Has been using over-the-counter AZO which improves her symptoms but then they returned.  She does have a history of postcoital UTIs and had been using Macrodantin 50 mg with intercourse but has run out and has not been using it.  Past medical history,surgical history, problem list, medications, allergies, family history and social history were all reviewed and documented in the EPIC chart.  Directed ROS with pertinent positives and negatives documented in the history of present illness/assessment and plan.  Exam:  Vitals:   03/14/18 1358  BP: 124/82   General appearance:  Normal Spine straight without CVA tenderness Abdomen soft nontender without masses guarding rebound  Assessment/Plan:  68 y.o. [id number] with history and urine analysis consistent with UTI.  Will treat with Macrobid 100 mg twice daily x7 days.  I also refilled her Macrodantin 50 mg prescription #30 to use 1 after intercourse.  She will follow-up if symptoms persist, worsen or recur.    Dara Lords MD, 2:35 PM 03/14/2018

## 2018-03-14 NOTE — Patient Instructions (Signed)
Take the Macrodantin antibiotic twice daily for 7 days.  Use the other prescription of Macrodantin to take 1 pill with intercourse to help prevent urinary tract infections.  Follow-up if your symptoms persist, worsen or recur.

## 2018-03-16 LAB — URINALYSIS, COMPLETE W/RFL CULTURE: HYALINE CAST: NONE SEEN /LPF

## 2018-03-16 LAB — URINE CULTURE
MICRO NUMBER: 316767
SPECIMEN QUALITY: ADEQUATE

## 2018-03-16 LAB — CULTURE INDICATED

## 2018-06-24 ENCOUNTER — Other Ambulatory Visit: Payer: Self-pay | Admitting: Family Medicine

## 2018-06-24 DIAGNOSIS — Z1231 Encounter for screening mammogram for malignant neoplasm of breast: Secondary | ICD-10-CM

## 2018-07-22 ENCOUNTER — Telehealth: Payer: Self-pay | Admitting: *Deleted

## 2018-07-22 MED ORDER — NITROFURANTOIN MACROCRYSTAL 50 MG PO CAPS: ORAL_CAPSULE | ORAL | 0 refills | Status: DC

## 2018-07-22 NOTE — Telephone Encounter (Signed)
patient informed, Rx sent.  

## 2018-07-22 NOTE — Telephone Encounter (Signed)
Ok for refill? 

## 2018-07-22 NOTE — Telephone Encounter (Signed)
Patient called requesting refill on macrodantin 50 mg tablet #30 with intercourse. Please advise

## 2018-08-05 ENCOUNTER — Other Ambulatory Visit: Payer: Self-pay

## 2018-08-05 ENCOUNTER — Ambulatory Visit
Admission: RE | Admit: 2018-08-05 | Discharge: 2018-08-05 | Disposition: A | Discharge status: Home / Self Care | Payer: Medicare HMO | Source: Ambulatory Visit | Attending: Family Medicine | Admitting: Family Medicine

## 2018-08-05 DIAGNOSIS — Z1231 Encounter for screening mammogram for malignant neoplasm of breast: Secondary | ICD-10-CM

## 2018-08-07 ENCOUNTER — Other Ambulatory Visit: Payer: Self-pay | Admitting: Family Medicine

## 2018-08-07 DIAGNOSIS — R928 Other abnormal and inconclusive findings on diagnostic imaging of breast: Secondary | ICD-10-CM

## 2018-08-08 ENCOUNTER — Other Ambulatory Visit: Payer: Self-pay

## 2018-08-08 ENCOUNTER — Ambulatory Visit
Admission: RE | Admit: 2018-08-08 | Discharge: 2018-08-08 | Disposition: A | Discharge status: Home / Self Care | Payer: Medicare HMO | Source: Ambulatory Visit | Attending: Family Medicine | Admitting: Family Medicine

## 2018-08-08 ENCOUNTER — Other Ambulatory Visit: Payer: Self-pay | Admitting: Family Medicine

## 2018-08-08 DIAGNOSIS — R928 Other abnormal and inconclusive findings on diagnostic imaging of breast: Secondary | ICD-10-CM

## 2018-08-08 DIAGNOSIS — N6002 Solitary cyst of left breast: Secondary | ICD-10-CM

## 2018-08-09 ENCOUNTER — Ambulatory Visit
Admission: RE | Admit: 2018-08-09 | Discharge: 2018-08-09 | Disposition: A | Discharge status: Home / Self Care | Payer: Medicare HMO | Source: Ambulatory Visit | Attending: Family Medicine | Admitting: Family Medicine

## 2018-08-09 ENCOUNTER — Other Ambulatory Visit: Payer: Medicare HMO

## 2018-08-09 DIAGNOSIS — N6002 Solitary cyst of left breast: Secondary | ICD-10-CM

## 2018-08-30 ENCOUNTER — Other Ambulatory Visit: Payer: Self-pay

## 2018-09-02 ENCOUNTER — Other Ambulatory Visit: Payer: Self-pay | Admitting: Women's Health

## 2018-09-02 ENCOUNTER — Ambulatory Visit (INDEPENDENT_AMBULATORY_CARE_PROVIDER_SITE_OTHER): Payer: Medicare HMO | Admitting: Women's Health

## 2018-09-02 ENCOUNTER — Encounter: Payer: Self-pay | Admitting: Women's Health

## 2018-09-02 ENCOUNTER — Other Ambulatory Visit: Payer: Self-pay

## 2018-09-02 VITALS — BP 110/78 | Ht 68.0 in | Wt 210.0 lb

## 2018-09-02 DIAGNOSIS — Z01419 Encounter for gynecological examination (general) (routine) without abnormal findings: Secondary | ICD-10-CM

## 2018-09-02 DIAGNOSIS — N898 Other specified noninflammatory disorders of vagina: Secondary | ICD-10-CM

## 2018-09-02 LAB — WET PREP FOR TRICH, YEAST, CLUE

## 2018-09-02 MED ORDER — NITROFURANTOIN MACROCRYSTAL 50 MG PO CAPS: ORAL_CAPSULE | ORAL | 1 refills | Status: DC

## 2018-09-02 MED ORDER — NYSTATIN 100000 UNIT/GM EX OINT: 1.0000 "application " | TOPICAL_OINTMENT | Freq: Two times a day (BID) | CUTANEOUS | 0 refills | Status: DC

## 2018-09-02 NOTE — Patient Instructions (Signed)
Health Maintenance After Age 67 After age 67, you are at a higher risk for certain long-term diseases and infections as well as injuries from falls. Falls are a major cause of broken bones and head injuries in people who are older than age 67. Getting regular preventive care can help to keep you healthy and well. Preventive care includes getting regular testing and making lifestyle changes as recommended by your health care provider. Talk with your health care provider about:  Which screenings and tests you should have. A screening is a test that checks for a disease when you have no symptoms.  A diet and exercise plan that is right for you. What should I know about screenings and tests to prevent falls? Screening and testing are the best ways to find a health problem early. Early diagnosis and treatment give you the best chance of managing medical conditions that are common after age 67. Certain conditions and lifestyle choices may make you more likely to have a fall. Your health care provider may recommend:  Regular vision checks. Poor vision and conditions such as cataracts can make you more likely to have a fall. If you wear glasses, make sure to get your prescription updated if your vision changes.  Medicine review. Work with your health care provider to regularly review all of the medicines you are taking, including over-the-counter medicines. Ask your health care provider about any side effects that may make you more likely to have a fall. Tell your health care provider if any medicines that you take make you feel dizzy or sleepy.  Osteoporosis screening. Osteoporosis is a condition that causes the bones to get weaker. This can make the bones weak and cause them to break more easily.  Blood pressure screening. Blood pressure changes and medicines to control blood pressure can make you feel dizzy.  Strength and balance checks. Your health care provider may recommend certain tests to check your  strength and balance while standing, walking, or changing positions.  Foot health exam. Foot pain and numbness, as well as not wearing proper footwear, can make you more likely to have a fall.  Depression screening. You may be more likely to have a fall if you have a fear of falling, feel emotionally low, or feel unable to do activities that you used to do.  Alcohol use screening. Using too much alcohol can affect your balance and may make you more likely to have a fall. What actions can I take to lower my risk of falls? General instructions  Talk with your health care provider about your risks for falling. Tell your health care provider if: ? You fall. Be sure to tell your health care provider about all falls, even ones that seem minor. ? You feel dizzy, sleepy, or off-balance.  Take over-the-counter and prescription medicines only as told by your health care provider. These include any supplements.  Eat a healthy diet and maintain a healthy weight. A healthy diet includes low-fat dairy products, low-fat (lean) meats, and fiber from whole grains, beans, and lots of fruits and vegetables. Home safety  Remove any tripping hazards, such as rugs, cords, and clutter.  Install safety equipment such as grab bars in bathrooms and safety rails on stairs.  Keep rooms and walkways well-lit. Activity   Follow a regular exercise program to stay fit. This will help you maintain your balance. Ask your health care provider what types of exercise are appropriate for you.  If you need a cane or   walker, use it as recommended by your health care provider.  Wear supportive shoes that have nonskid soles. Lifestyle  Do not drink alcohol if your health care provider tells you not to drink.  If you drink alcohol, limit how much you have: ? 0-1 drink a day for women. ? 0-2 drinks a day for men.  Be aware of how much alcohol is in your drink. In the [place] one drink equals one typical bottle of beer (12  oz), one-half glass of wine (5 oz), or one shot of hard liquor (1 oz).  Do not use any products that contain nicotine or tobacco, such as cigarettes and e-cigarettes. If you need help quitting, ask your health care provider. Summary  Having a healthy lifestyle and getting preventive care can help to protect your health and wellness after age 67.  Screening and testing are the best way to find a health problem early and help you avoid having a fall. Early diagnosis and treatment give you the best chance for managing medical conditions that are more common for people who are older than age 67.  Falls are a major cause of broken bones and head injuries in people who are older than age 67. Take precautions to prevent a fall at home.  Work with your health care provider to learn what changes you can make to improve your health and wellness and to prevent falls. This information is not intended to replace advice given to you by your health care provider. Make sure you discuss any questions you have with your health care provider. Document Released: 11/01/2016 Document Revised: 04/11/2018 Document Reviewed: 11/01/2016 Elsevier Patient Education  2020 [employer]  

## 2018-09-02 NOTE — Progress Notes (Signed)
DOCIA KLAR 07-Aug-1950 [id number]    History:    Presents for breast and pelvic exam with no complaints.  Reports struggles with weight although eating a low-carb/calorie diet.  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  08/2018 normal mammogram after cyst aspiration.  Primary care manages celiac, hypothyroidism, hypercholesterolemia and osteopenia.  2019 T score -1.5 FRAX 9.7% / 2%.  2011- polyp on colonoscopy.  Has had Pneumovax and is planning to get  Shingrex.    Past medical history, past surgical history, family history and social history were all reviewed and documented in the EPIC chart.  2 children, son recently had a second child 21-year-old grandson Herschel [place] has cerebral palsy but is doing well with the baby.  Minimal contact with daughter who has a history of drug abuse.  ROS:  A ROS was performed and pertinent positives and negatives are included.  Exam:  Vitals:   09/02/18 1426  BP: 110/78  Weight: 210 lb (95.3 kg)  Height: 5\' 8"  (1.727 m)   Body mass index is 31.93 kg/m.   General appearance:  Normal Thyroid:  Symmetrical, normal in size, without palpable masses or nodularity. Respiratory  Auscultation:  Clear without wheezing or rhonchi Cardiovascular  Auscultation:  Regular rate, without rubs, murmurs or gallops  Edema/varicosities:  Not grossly evident Abdominal  Soft,nontender, without masses, guarding or rebound.  Liver/spleen:  No organomegaly noted  Hernia:  None appreciated  Skin  Inspection:  Grossly normal   Breasts: Examined lying and sitting.     Right: Without masses, retractions, discharge or axillary adenopathy.     Left: Without masses, retractions, discharge or axillary adenopathy. Gentitourinary   Inguinal/mons:  Normal without inguinal adenopathy  External genitalia: normal  BUS/Urethra/Skene's glands:  Normal  Vagina: Mild atrophy wet prep negative    Cervix:  Normal  Uterus:   normal in size, shape and contour.  Midline and mobile  Adnexa/parametria:     Rt: Without masses or tenderness.   Lt: Without masses or tenderness.  Anus and perineum: Excoriated area at posterior fornix extending toward rectum   Digital rectal exam: Normal sphincter tone without palpated masses or tenderness  Assessment/Plan:  68 y.o. MWF G2, P2 for breast and pelvic exam with complaint of mild itching on the perineum.  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  Instructed to call if continued problems with irritation with itching.  Reviewed wet prep was negative.  [name], continue annual screening mammogram, calcium rich foods, vitamin D 2000 daily encouraged.  Reviewed importance of continuing active lifestyle with regular weightbearing exercise, balance type exercise such as yoga encouraged.  Home safety, fall prevention discussed.  Continue low carb/calorie diet.  Reviewed both DEXA and colonoscopy will be due next year    [place], 2:37 PM 09/02/2018

## 2018-09-04 LAB — URINALYSIS, COMPLETE W/RFL CULTURE
Bacteria, UA: NONE SEEN /HPF
Bilirubin Urine: NEGATIVE
Glucose, UA: NEGATIVE
Hgb urine dipstick: NEGATIVE
Hyaline Cast: NONE SEEN /LPF
Ketones, ur: NEGATIVE
Leukocyte Esterase: NEGATIVE
Nitrites, Initial: NEGATIVE
Protein, ur: NEGATIVE
RBC / HPF: NONE SEEN /HPF (ref 0–2)
Specific Gravity, Urine: 1.02 (ref 1.001–1.03)
pH: 6.5 (ref 5.0–8.0)

## 2018-09-04 LAB — URINE CULTURE
MICRO NUMBER:: 833922
SPECIMEN QUALITY:: ADEQUATE

## 2018-09-04 LAB — CULTURE INDICATED

## 2018-10-08 ENCOUNTER — Other Ambulatory Visit: Payer: Self-pay

## 2018-10-08 DIAGNOSIS — Z20822 Contact with and (suspected) exposure to covid-19: Secondary | ICD-10-CM

## 2018-10-09 ENCOUNTER — Encounter: Payer: Self-pay | Admitting: Gynecology

## 2018-10-10 LAB — NOVEL CORONAVIRUS, NAA: SARS-CoV-2, NAA: NOT DETECTED

## 2019-01-27 ENCOUNTER — Ambulatory Visit: Payer: Medicare HMO | Attending: Internal Medicine

## 2019-01-27 DIAGNOSIS — Z23 Encounter for immunization: Secondary | ICD-10-CM | POA: Insufficient documentation

## 2019-01-27 NOTE — Progress Notes (Signed)
   Covid-19 Vaccination Clinic  Name:  BREEA LONCAR    MRN: [id number] DOB: [birth year]  01/27/2019  Ms. Haack was observed post Covid-19 immunization for 15 minutes without incidence. She was provided with Vaccine Information Sheet and instruction to access the V-Safe system.   Ms. Godette was instructed to call 911 with any severe reactions post vaccine: Marland Kitchen Difficulty breathing  . Swelling of your face and throat  . A fast heartbeat  . A bad rash all over your body  . Dizziness and weakness    Immunizations Administered    Name Date Dose VIS Date Route   Pfizer COVID-19 Vaccine 01/27/2019 11:47 AM 0.3 mL 12/13/2018 Intramuscular   Manufacturer: [employer], [employer]   Lot: [id number]   NDC: [address]-1

## 2019-02-14 ENCOUNTER — Ambulatory Visit: Payer: Medicare HMO | Attending: Internal Medicine

## 2019-02-14 DIAGNOSIS — Z23 Encounter for immunization: Secondary | ICD-10-CM | POA: Insufficient documentation

## 2019-02-14 NOTE — Progress Notes (Signed)
   Covid-19 Vaccination Clinic  Name:  Donna Haney    MRN: [id number] DOB: Nov 05, 1950  02/14/2019  Donna Haney was observed post Covid-19 immunization for 15 minutes without incidence. She was provided with Vaccine Information Sheet and instruction to access the V-Safe system.   Donna Haney was instructed to call 911 with any severe reactions post vaccine: Marland Kitchen Difficulty breathing  . Swelling of your face and throat  . A fast heartbeat  . A bad rash all over your body  . Dizziness and weakness    Immunizations Administered    Name Date Dose VIS Date Route   Pfizer COVID-19 Vaccine 02/14/2019  8:23 AM 0.3 mL 12/13/2018 Intramuscular   Manufacturer: [employer], [employer]   Lot: [id number]   NDC: [address]-4

## 2019-06-23 ENCOUNTER — Telehealth: Payer: Self-pay | Admitting: *Deleted

## 2019-06-23 MED ORDER — NITROFURANTOIN MACROCRYSTAL 50 MG PO CAPS: ORAL_CAPSULE | ORAL | 1 refills | Status: DC

## 2019-06-23 NOTE — Telephone Encounter (Signed)
Patient informed, Rx sent.  

## 2019-06-23 NOTE — Telephone Encounter (Signed)
Please send to pharmacy: Microdantin 50 mg tablets as needed with intercourse # 30 with 1 refill. Thank you

## 2019-06-23 NOTE — Telephone Encounter (Signed)
Patient called requesting refill on macrodantin 50 mg tablet #30 with intercourse, annual exam scheduled on 09/04/19. Please advise

## 2019-09-04 ENCOUNTER — Encounter: Payer: Medicare HMO | Admitting: Nurse Practitioner

## 2019-09-04 ENCOUNTER — Other Ambulatory Visit: Payer: Self-pay

## 2019-09-04 ENCOUNTER — Other Ambulatory Visit: Payer: Self-pay | Admitting: Nurse Practitioner

## 2019-09-04 ENCOUNTER — Ambulatory Visit (INDEPENDENT_AMBULATORY_CARE_PROVIDER_SITE_OTHER): Payer: Medicare HMO | Admitting: Nurse Practitioner

## 2019-09-04 ENCOUNTER — Encounter: Payer: Self-pay | Admitting: Nurse Practitioner

## 2019-09-04 VITALS — BP 126/80 | Ht 68.0 in | Wt 210.0 lb

## 2019-09-04 DIAGNOSIS — Z01419 Encounter for gynecological examination (general) (routine) without abnormal findings: Secondary | ICD-10-CM

## 2019-09-04 DIAGNOSIS — M85852 Other specified disorders of bone density and structure, left thigh: Secondary | ICD-10-CM

## 2019-09-04 DIAGNOSIS — M85851 Other specified disorders of bone density and structure, right thigh: Secondary | ICD-10-CM | POA: Diagnosis not present

## 2019-09-04 DIAGNOSIS — Z1231 Encounter for screening mammogram for malignant neoplasm of breast: Secondary | ICD-10-CM

## 2019-09-04 DIAGNOSIS — Z780 Asymptomatic menopausal state: Secondary | ICD-10-CM | POA: Diagnosis not present

## 2019-09-04 NOTE — Patient Instructions (Addendum)
Schedule mammogram now - [facility] Schedule colonoscopy now - [facility]  Schedule bone Density middle of October  Osteopenia  Osteopenia is a loss of thickness (density) inside of the bones. Another name for osteopenia is low bone mass. Mild osteopenia is a normal part of aging. It is not a disease, and it does not cause symptoms. However, if you have osteopenia and continue to lose bone mass, you could develop a condition that causes the bones to become thin and break more easily (osteoporosis). You may also lose some height, have back pain, and have a stooped posture. Although osteopenia is not a disease, making changes to your lifestyle and diet can help to prevent osteopenia from developing into osteoporosis. What are the causes? Osteopenia is caused by loss of calcium in the bones.  Bones are constantly changing. Old bone cells are continually being replaced with new bone cells. This process builds new bone. The mineral calcium is needed to build new bone and maintain bone density. Bone density is usually highest around age 52. After that, most people's bodies cannot replace all the bone they have lost with new bone. What increases the risk? You are more likely to develop this condition if:  You are older than age 79.  You are a woman who went through menopause early.  You have a long illness that keeps you in bed.  You do not get enough exercise.  You lack certain nutrients (malnutrition).  You have an overactive thyroid gland (hyperthyroidism).  You smoke.  You drink a lot of alcohol.  You are taking medicines that weaken the bones, such as steroids. What are the signs or symptoms? This condition does not cause any symptoms. You may have a slightly higher risk for bone breaks (fractures), so getting fractures more easily than normal may be an indication of osteopenia. How is this diagnosed? Your health care provider can diagnose this condition with a special  type of X-ray exam that measures bone density (dual-energy X-ray absorptiometry, DEXA). This test can measure bone density in your hips, spine, and wrists. Osteopenia has no symptoms, so this condition is usually diagnosed after a routine bone density screening test is done for osteoporosis. This routine screening is usually done for:  Women who are age 36 or older.  Men who are age 75 or older. If you have risk factors for osteopenia, you may have the screening test at an earlier age. How is this treated? Making dietary and lifestyle changes can lower your risk for osteoporosis. If you have severe osteopenia that is close to becoming osteoporosis, your health care provider may prescribe medicines and dietary supplements such as calcium and vitamin D. These supplements help to rebuild bone density. Follow these instructions at home:   Take over-the-counter and prescription medicines only as told by your health care provider. These include vitamins and supplements.  Eat a diet that is high in calcium and vitamin D. ? Calcium is found in dairy products, beans, salmon, and leafy green vegetables like spinach and broccoli. ? Look for foods that have vitamin D and calcium added to them (fortified foods), such as orange juice, cereal, and bread.  Do 30 or more minutes of a weight-bearing exercise every day, such as walking, jogging, or playing a sport. These types of exercises strengthen the bones.  Take precautions at home to lower your risk of falling, such as: ? Keeping rooms well-lit and free of clutter, such as cords. ? Research scientist (life sciences) rails on  stairs. ? Using rubber mats in the bathroom or other areas that are often wet or slippery.  Do not use any products that contain nicotine or tobacco, such as cigarettes and e-cigarettes. If you need help quitting, ask your health care provider.  Avoid alcohol or limit alcohol intake to no more than 1 drink a day for nonpregnant women and 2 drinks a  day for men. One drink equals 12 oz of beer, 5 oz of wine, or 1 oz of hard liquor.  Keep all follow-up visits as told by your health care provider. This is important. Contact a health care provider if:  You have not had a bone density screening for osteoporosis and you are: ? A woman, age 16 or older. ? A man, age 40 or older.  You are a postmenopausal woman who has not had a bone density screening for osteoporosis.  You are older than age 66 and you want to know if you should have bone density screening for osteoporosis. Summary  Osteopenia is a loss of thickness (density) inside of the bones. Another name for osteopenia is low bone mass.  Osteopenia is not a disease, but it may increase your risk for a condition that causes the bones to become thin and break more easily (osteoporosis).  You may be at risk for osteopenia if you are older than age 54 or if you are a woman who went through early menopause.  Osteopenia does not cause any symptoms, but it can be diagnosed with a bone density screening test.  Dietary and lifestyle changes are the first treatment for osteopenia. These may lower your risk for osteoporosis. This information is not intended to replace advice given to you by your health care provider. Make sure you discuss any questions you have with your health care provider. Document Revised: 12/01/2016 Document Reviewed: 09/27/2016 Elsevier Patient Education  2020 [employer]. Health Maintenance After Age 67 After age 21, you are at a higher risk for certain long-term diseases and infections as well as injuries from falls. Falls are a major cause of broken bones and head injuries in people who are older than age 61. Getting regular preventive care can help to keep you healthy and well. Preventive care includes getting regular testing and making lifestyle changes as recommended by your health care provider. Talk with your health care provider about:  Which screenings and  tests you should have. A screening is a test that checks for a disease when you have no symptoms.  A diet and exercise plan that is right for you. What should I know about screenings and tests to prevent falls? Screening and testing are the best ways to find a health problem early. Early diagnosis and treatment give you the best chance of managing medical conditions that are common after age 38. Certain conditions and lifestyle choices may make you more likely to have a fall. Your health care provider may recommend:  Regular vision checks. Poor vision and conditions such as cataracts can make you more likely to have a fall. If you wear glasses, make sure to get your prescription updated if your vision changes.  Medicine review. Work with your health care provider to regularly review all of the medicines you are taking, including over-the-counter medicines. Ask your health care provider about any side effects that may make you more likely to have a fall. Tell your health care provider if any medicines that you take make you feel dizzy or sleepy.  Osteoporosis screening. Osteoporosis  is a condition that causes the bones to get weaker. This can make the bones weak and cause them to break more easily.  Blood pressure screening. Blood pressure changes and medicines to control blood pressure can make you feel dizzy.  Strength and balance checks. Your health care provider may recommend certain tests to check your strength and balance while standing, walking, or changing positions.  Foot health exam. Foot pain and numbness, as well as not wearing proper footwear, can make you more likely to have a fall.  Depression screening. You may be more likely to have a fall if you have a fear of falling, feel emotionally low, or feel unable to do activities that you used to do.  Alcohol use screening. Using too much alcohol can affect your balance and may make you more likely to have a fall. What actions can I  take to lower my risk of falls? General instructions  Talk with your health care provider about your risks for falling. Tell your health care provider if: ? You fall. Be sure to tell your health care provider about all falls, even ones that seem minor. ? You feel dizzy, sleepy, or off-balance.  Take over-the-counter and prescription medicines only as told by your health care provider. These include any supplements.  Eat a healthy diet and maintain a healthy weight. A healthy diet includes low-fat dairy products, low-fat (lean) meats, and fiber from whole grains, beans, and lots of fruits and vegetables. Home safety  Remove any tripping hazards, such as rugs, cords, and clutter.  Install safety equipment such as grab bars in bathrooms and safety rails on stairs.  Keep rooms and walkways well-lit. Activity   Follow a regular exercise program to stay fit. This will help you maintain your balance. Ask your health care provider what types of exercise are appropriate for you.  If you need a cane or walker, use it as recommended by your health care provider.  Wear supportive shoes that have nonskid soles. Lifestyle  Do not drink alcohol if your health care provider tells you not to drink.  If you drink alcohol, limit how much you have: ? 0-1 drink a day for women. ? 0-2 drinks a day for men.  Be aware of how much alcohol is in your drink. In the [place] one drink equals one typical bottle of beer (12 oz), one-half glass of wine (5 oz), or one shot of hard liquor (1 oz).  Do not use any products that contain nicotine or tobacco, such as cigarettes and e-cigarettes. If you need help quitting, ask your health care provider. Summary  Having a healthy lifestyle and getting preventive care can help to protect your health and wellness after age 68.  Screening and testing are the best way to find a health problem early and help you avoid having a fall. Early diagnosis and treatment give you  the best chance for managing medical conditions that are more common for people who are older than age 68.  Falls are a major cause of broken bones and head injuries in people who are older than age 68. Take precautions to prevent a fall at home.  Work with your health care provider to learn what changes you can make to improve your health and wellness and to prevent falls. This information is not intended to replace advice given to you by your health care provider. Make sure you discuss any questions you have with your health care provider. Document Revised: 04/11/2018 Document  Reviewed: 11/01/2016 Elsevier Patient Education  [employer].

## 2019-09-04 NOTE — Progress Notes (Signed)
   Donna Haney 01/02/1951 [id number]   History:  69 y.o. G2P2002 presents for beast and pelvic exam without GYN complaints. 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 History of osteopenia. 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 Left breast cyst aspiration August 2020. History of UTIs with intercourse, takes Macrodantin 50 mg as needed.   Gynecologic History No LMP recorded. Patient is postmenopausal.   Last Pap: 08/28/2016. Results were: normal Last mammogram: 08/05/2018. Results were: Left breast cyst Last colonoscopy: 06/07/2009. Results were: polyps Last Dexa: 10/08/2017. Results were: t-score -1.7, FRAX 9.7% / 2.0%  Past medical history, past surgical history, family history and social history were all reviewed and documented in the EPIC chart.  ROS:  A ROS was performed and pertinent positives and negatives are included.  Exam:  Vitals:   09/04/19 1418  BP: 126/80  Weight: 210 lb (95.3 kg)  Height: 5\' 8"  (1.727 m)   Body mass index is 31.93 kg/m.  General appearance:  Normal Thyroid:  Symmetrical, normal in size, without palpable masses or nodularity. Respiratory  Auscultation:  Clear without wheezing or rhonchi Cardiovascular  Auscultation:  Regular rate, without rubs, murmurs or gallops  Edema/varicosities:  Not grossly evident Abdominal  Soft,nontender, without masses, guarding or rebound.  Liver/spleen:  No organomegaly noted  Hernia:  None appreciated  Skin  Inspection:  Grossly normal   Breasts: Examined lying and sitting.   Right: Without masses, retractions, discharge or axillary adenopathy.   Left: Without masses, retractions, discharge or axillary adenopathy. Gentitourinary   Inguinal/mons:  Normal without inguinal adenopathy  External genitalia:  Normal  BUS/Urethra/Skene's glands:  Normal  Vagina:  Normal  Cervix:  Normal  Uterus:  Difficult to palpate due to body habitus but no gross masses or  tenderness  Adnexa/parametria:     Rt: Without masses or tenderness.   Lt: Without masses or tenderness.  Anus and perineum: Normal  Digital rectal exam: Normal sphincter tone without palpated masses or tenderness  Assessment/Plan:  69 y.o. 74 for breast and pelvic exam.   Well female exam with routine gynecological exam - Education provided on SBEs, importance of preventative screenings, current guidelines, high calcium diet, regular exercise, and multivitamin daily. Labs done with endocrinology and primary care.   Osteopenia of necks of both femurs - Most recent October 2019 showed t-score -1.7, FRAX 9.7% / 2.0%. She will call to schedule this in the next 1-2 months.   Postmenopausal - No HRT, no bleeding.  Screening for breast cancer - mammogram due. Information provided on the breast center. She promises to schedule this soon.  Screening for colon cancer - most recent in 2011. She plans to schedule this soon with [facility].   Follow up in 1 year for annual       2012 [facility], 2:34 PM 09/04/2019

## 2019-09-16 ENCOUNTER — Ambulatory Visit: Payer: Medicare HMO

## 2019-09-26 ENCOUNTER — Other Ambulatory Visit: Payer: Self-pay

## 2019-09-26 ENCOUNTER — Ambulatory Visit
Admission: RE | Admit: 2019-09-26 | Discharge: 2019-09-26 | Disposition: A | Discharge status: Home / Self Care | Payer: Medicare HMO | Source: Ambulatory Visit | Attending: Nurse Practitioner | Admitting: Nurse Practitioner

## 2019-09-26 DIAGNOSIS — Z1231 Encounter for screening mammogram for malignant neoplasm of breast: Secondary | ICD-10-CM

## 2019-11-11 ENCOUNTER — Ambulatory Visit (INDEPENDENT_AMBULATORY_CARE_PROVIDER_SITE_OTHER): Payer: Medicare HMO

## 2019-11-11 ENCOUNTER — Other Ambulatory Visit: Payer: Self-pay

## 2019-11-11 ENCOUNTER — Other Ambulatory Visit: Payer: Self-pay | Admitting: Nurse Practitioner

## 2019-11-11 DIAGNOSIS — Z780 Asymptomatic menopausal state: Secondary | ICD-10-CM | POA: Diagnosis not present

## 2019-11-11 DIAGNOSIS — M8589 Other specified disorders of bone density and structure, multiple sites: Secondary | ICD-10-CM

## 2019-11-21 ENCOUNTER — Telehealth: Payer: Self-pay

## 2019-11-21 NOTE — Telephone Encounter (Signed)
Patient called for BD results. Informed result still with osteopenia. Continue current regiment and repeat in 2 years. I told her that her result should be mailed out to her next week.

## 2020-03-02 DEATH — deceased

## 2020-08-24 ENCOUNTER — Other Ambulatory Visit: Payer: Self-pay | Admitting: Nurse Practitioner

## 2020-08-24 DIAGNOSIS — Z1231 Encounter for screening mammogram for malignant neoplasm of breast: Secondary | ICD-10-CM

## 2020-09-07 ENCOUNTER — Encounter: Payer: Self-pay | Admitting: Nurse Practitioner

## 2020-09-07 ENCOUNTER — Other Ambulatory Visit: Payer: Self-pay

## 2020-09-07 ENCOUNTER — Ambulatory Visit (INDEPENDENT_AMBULATORY_CARE_PROVIDER_SITE_OTHER): Payer: BC Managed Care – PPO | Admitting: Nurse Practitioner

## 2020-09-07 VITALS — BP 122/78 | Ht 68.0 in | Wt 210.0 lb

## 2020-09-07 DIAGNOSIS — Z780 Asymptomatic menopausal state: Secondary | ICD-10-CM

## 2020-09-07 DIAGNOSIS — Z01419 Encounter for gynecological examination (general) (routine) without abnormal findings: Secondary | ICD-10-CM | POA: Diagnosis not present

## 2020-09-07 DIAGNOSIS — N390 Urinary tract infection, site not specified: Secondary | ICD-10-CM

## 2020-09-07 DIAGNOSIS — M85852 Other specified disorders of bone density and structure, left thigh: Secondary | ICD-10-CM

## 2020-09-07 DIAGNOSIS — M85851 Other specified disorders of bone density and structure, right thigh: Secondary | ICD-10-CM

## 2020-09-07 MED ORDER — NITROFURANTOIN MACROCRYSTAL 50 MG PO CAPS: ORAL_CAPSULE | ORAL | 1 refills | Status: DC

## 2020-09-07 NOTE — Progress Notes (Signed)
PATTIANN SOLANKI 16-Oct-1950 [id number]   History:  70 y.o. G2P2002 presents for beast and pelvic exam without GYN complaints. 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 History of osteopenia. 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 Left breast cyst aspiration. August 2020. History of UTIs with intercourse, takes Macrodantin 50 mg as needed. She has been struggling with weight loss despite watching her diet and exercising 5+ days per week.   Gynecologic History No LMP recorded. Patient is postmenopausal.   Health Maintenance Last Pap: 08/28/2016. Results were: normal Last mammogram: 09/26/2019. Results were: Normal Last colonoscopy: 2017. Results were:  Last Dexa: 11/11/2019. Results were: t-score -1.4, FRAX 9.1% / 1.7%  Past medical history, past surgical history, family history and social history were all reviewed and documented in the EPIC chart.  ROS:  A ROS was performed and pertinent positives and negatives are included.  Exam:  Vitals:   09/07/20 1331  BP: 122/78  Weight: 210 lb (95.3 kg)  Height: 5\' 8"  (1.727 m)    Body mass index is 31.93 kg/m.  General appearance:  Normal Thyroid:  Symmetrical, normal in size, without palpable masses or nodularity. Respiratory  Auscultation:  Clear without wheezing or rhonchi Cardiovascular  Auscultation:  Regular rate, without rubs, or gallops. 1/6 murmur  Edema/varicosities:  Not grossly evident Abdominal  Soft,nontender, without masses, guarding or rebound.  Liver/spleen:  No organomegaly noted  Hernia:  None appreciated  Skin  Inspection:  Grossly normal   Breasts: Examined lying and sitting.   Right: Without masses, retractions, discharge or axillary adenopathy.   Left: Without masses, retractions, discharge or axillary adenopathy. Gentitourinary   Inguinal/mons:  Normal without inguinal adenopathy  External genitalia:  Normal  BUS/Urethra/Skene's glands:  Normal  Vagina:   Normal  Cervix:  Normal  Uterus:  Difficult to palpate due to body habitus but no gross masses or tenderness  Adnexa/parametria:     Rt: Without masses or tenderness.   Lt: Without masses or tenderness.  Anus and perineum: Normal  Digital rectal exam: Normal sphincter tone without palpated masses or tenderness  Patient informed chaperone available to be present for breast and pelvic exam. Patient has requested no chaperone to be present. Patient has been advised what will be completed during breast and pelvic exam.   Assessment/Plan:  70 y.o. G2P2002 for breast and pelvic exam.   Well female exam with routine gynecological exam - Education provided on SBEs, importance of preventative screenings, current guidelines, high calcium diet, regular exercise, and multivitamin daily. Labs with PCP, endocrinology.   Postmenopausal - No HRT, no bleeding.   Osteopenia of necks of both femurs - T-score -1.4 without elevated FRAX. Continue Vitamin D + Calcium supplement and regular exercise.   Postcoital UTI - Plan: nitrofurantoin (MACRODANTIN) 50 MG capsule as needed with intercourse. She takes sparingly. #30 with 1 refill provided.   Weight management - she is struggling with weight loss despite watching her diet and exercising 5+ times per week. She does have thyroid disease, TSH normal. Discussed calorie deficit, intermittent fasting, avoiding late night eating, incorporating cardio into exercise routine. If no changes with consistency, it is recommended she see PCP or weight loss clinic to discuss further management.   Screening for cervical cancer - 2006 CIN-1, subsequent paps normal. No longer screening per guidelines and she is agreeable.   Screening for breast cancer - Normal mammogram history.  Continue annual screenings.  Normal breast exam today.  Screening for colon cancer - 2017 colonoscopy. Scheduled  in November.   Follow up in 1 year for annual       Olivia Mackie [facility], 2:33  PM 09/07/2020

## 2020-09-30 ENCOUNTER — Other Ambulatory Visit: Payer: Self-pay

## 2020-09-30 ENCOUNTER — Ambulatory Visit
Admission: RE | Admit: 2020-09-30 | Discharge: 2020-09-30 | Disposition: A | Discharge status: Home / Self Care | Payer: Medicare HMO | Source: Ambulatory Visit | Attending: Nurse Practitioner | Admitting: Nurse Practitioner

## 2020-09-30 DIAGNOSIS — Z1231 Encounter for screening mammogram for malignant neoplasm of breast: Secondary | ICD-10-CM

## 2020-10-06 ENCOUNTER — Other Ambulatory Visit: Payer: Self-pay | Admitting: Nurse Practitioner

## 2020-10-06 DIAGNOSIS — R928 Other abnormal and inconclusive findings on diagnostic imaging of breast: Secondary | ICD-10-CM

## 2020-10-22 ENCOUNTER — Other Ambulatory Visit: Payer: Self-pay

## 2020-10-22 ENCOUNTER — Ambulatory Visit
Admission: RE | Admit: 2020-10-22 | Discharge: 2020-10-22 | Disposition: A | Discharge status: Home / Self Care | Payer: Medicare HMO | Source: Ambulatory Visit | Attending: Nurse Practitioner | Admitting: Nurse Practitioner

## 2020-10-22 DIAGNOSIS — R928 Other abnormal and inconclusive findings on diagnostic imaging of breast: Secondary | ICD-10-CM

## 2021-09-14 ENCOUNTER — Other Ambulatory Visit: Payer: Self-pay | Admitting: Nurse Practitioner

## 2021-09-14 DIAGNOSIS — Z1231 Encounter for screening mammogram for malignant neoplasm of breast: Secondary | ICD-10-CM

## 2021-10-13 ENCOUNTER — Ambulatory Visit
Admission: RE | Admit: 2021-10-13 | Discharge: 2021-10-13 | Disposition: A | Discharge status: Home / Self Care | Payer: Medicare HMO | Source: Ambulatory Visit | Attending: Nurse Practitioner | Admitting: Nurse Practitioner

## 2021-10-13 DIAGNOSIS — Z1231 Encounter for screening mammogram for malignant neoplasm of breast: Secondary | ICD-10-CM

## 2021-12-20 ENCOUNTER — Other Ambulatory Visit: Payer: Self-pay | Admitting: Family Medicine

## 2021-12-20 DIAGNOSIS — R928 Other abnormal and inconclusive findings on diagnostic imaging of breast: Secondary | ICD-10-CM

## 2021-12-22 ENCOUNTER — Other Ambulatory Visit: Payer: Self-pay | Admitting: Family Medicine

## 2021-12-22 DIAGNOSIS — R928 Other abnormal and inconclusive findings on diagnostic imaging of breast: Secondary | ICD-10-CM

## 2022-01-04 ENCOUNTER — Other Ambulatory Visit: Payer: Self-pay | Admitting: Family Medicine

## 2022-01-04 DIAGNOSIS — N6320 Unspecified lump in the left breast, unspecified quadrant: Secondary | ICD-10-CM

## 2022-01-09 ENCOUNTER — Ambulatory Visit
Admission: RE | Admit: 2022-01-09 | Discharge: 2022-01-09 | Disposition: A | Discharge status: Home / Self Care | Payer: Medicare HMO | Source: Ambulatory Visit | Attending: Family Medicine | Admitting: Family Medicine

## 2022-01-09 ENCOUNTER — Ambulatory Visit: Payer: Medicare HMO

## 2022-01-09 ENCOUNTER — Other Ambulatory Visit: Payer: Medicare HMO

## 2022-01-09 DIAGNOSIS — N6320 Unspecified lump in the left breast, unspecified quadrant: Secondary | ICD-10-CM

## 2022-04-20 ENCOUNTER — Other Ambulatory Visit (HOSPITAL_BASED_OUTPATIENT_CLINIC_OR_DEPARTMENT_OTHER): Payer: Self-pay

## 2022-09-11 ENCOUNTER — Other Ambulatory Visit: Payer: Self-pay | Admitting: Family Medicine

## 2022-09-11 DIAGNOSIS — Z1231 Encounter for screening mammogram for malignant neoplasm of breast: Secondary | ICD-10-CM

## 2022-10-09 ENCOUNTER — Other Ambulatory Visit (HOSPITAL_COMMUNITY)
Admission: RE | Admit: 2022-10-09 | Discharge: 2022-10-09 | Disposition: A | Discharge status: Home / Self Care | Payer: Medicare HMO | Source: Ambulatory Visit | Attending: Nurse Practitioner | Admitting: Nurse Practitioner

## 2022-10-09 ENCOUNTER — Ambulatory Visit (INDEPENDENT_AMBULATORY_CARE_PROVIDER_SITE_OTHER): Payer: Medicare HMO | Admitting: Nurse Practitioner

## 2022-10-09 ENCOUNTER — Encounter: Payer: Self-pay | Admitting: Nurse Practitioner

## 2022-10-09 VITALS — BP 122/88 | HR 82 | Ht 67.0 in | Wt 196.0 lb

## 2022-10-09 DIAGNOSIS — Z780 Asymptomatic menopausal state: Secondary | ICD-10-CM | POA: Diagnosis not present

## 2022-10-09 DIAGNOSIS — Z1151 Encounter for screening for human papillomavirus (HPV): Secondary | ICD-10-CM | POA: Insufficient documentation

## 2022-10-09 DIAGNOSIS — M85851 Other specified disorders of bone density and structure, right thigh: Secondary | ICD-10-CM

## 2022-10-09 DIAGNOSIS — Z01419 Encounter for gynecological examination (general) (routine) without abnormal findings: Secondary | ICD-10-CM

## 2022-10-09 DIAGNOSIS — Z124 Encounter for screening for malignant neoplasm of cervix: Secondary | ICD-10-CM | POA: Insufficient documentation

## 2022-10-09 DIAGNOSIS — M85852 Other specified disorders of bone density and structure, left thigh: Secondary | ICD-10-CM

## 2022-10-09 DIAGNOSIS — N390 Urinary tract infection, site not specified: Secondary | ICD-10-CM | POA: Diagnosis not present

## 2022-10-09 MED ORDER — NITROFURANTOIN MACROCRYSTAL 50 MG PO CAPS: ORAL_CAPSULE | ORAL | 1 refills | Status: AC

## 2022-10-09 NOTE — Progress Notes (Signed)
Donna Haney 06/23/1950 [id number]   History:  72 y.o. G2P2002 presents for beast and pelvic exam without GYN complaints. 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 History of osteopenia. 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 History of UTIs with intercourse, takes Macrodantin 50 mg as needed with good management.   Gynecologic History No LMP recorded. Patient is postmenopausal.   Sexually active: Yes  Health Maintenance Last Pap: 08/28/2016. Results were: Normal neg HPV Last mammogram: 10/13/2021. Results were: Normal. Left breast diagnostic 01/09/22 normal. Scheduled 10/14 Last colonoscopy: 11/2020, 5-year recall Last Dexa: 11/11/2019. Results were: T-score -1.4, FRAX 9.1% / 1.7%  Past medical history, past surgical history, family history and social history were all reviewed and documented in the EPIC chart. Married. Cleans houses part-time. Husband helps with family landscaping business. Son lives in [place], 2 sons ages 62 and 4. Daughter in [place].   ROS:  A ROS was performed and pertinent positives and negatives are included.  Exam:  Vitals:   10/09/22 1452  BP: 122/88  Pulse: 82  SpO2: 96%  Weight: 196 lb (88.9 kg)  Height: 5\' 7"  (1.702 m)     Body mass index is 30.7 kg/m.  General appearance:  Normal Thyroid:  Symmetrical, normal in size, without palpable masses or nodularity. Respiratory  Auscultation:  Clear without wheezing or rhonchi Cardiovascular  Auscultation:  Regular rate, without rubs, or gallops. 1/6 murmur  Edema/varicosities:  Not grossly evident Abdominal  Soft,nontender, without masses, guarding or rebound.  Liver/spleen:  No organomegaly noted  Hernia:  None appreciated  Skin  Inspection:  Grossly normal Breasts: Examined lying and sitting.   Right: Without masses, retractions, discharge or axillary adenopathy.   Left: Without masses, retractions, discharge or axillary adenopathy. Pelvic: External genitalia:   no lesions              Urethra:  normal appearing urethra with no masses, tenderness or lesions              Bartholins and Skenes: normal                 Vagina: normal appearing vagina with normal color and discharge, no lesions              Cervix: no lesions Bimanual Exam:  Uterus:  no masses or tenderness              Adnexa: no mass, fullness, tenderness              Rectovaginal: Deferred              Anus:  normal, no lesions  Patient informed chaperone available to be present for breast and pelvic exam. Patient has requested no chaperone to be present. Patient has been advised what will be completed during breast and pelvic exam.   Assessment/Plan:  71 y.o. G2P2002 for breast and pelvic exam.   Encounter for breast and pelvic examination - Education provided on SBEs, importance of preventative screenings, current guidelines, high calcium diet, regular exercise, and multivitamin daily. Labs with PCP, endocrinology.   Postmenopausal - No HRT, no bleeding.   Postcoital UTI - Plan: nitrofurantoin (MACRODANTIN) 50 MG capsule as needed with intercourse. Good management. Uses sparingly.   Screening for cervical cancer - Plan: Cytology - PAP( [place]). 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 Will stop screenings if normal per guidelines.   Osteopenia of necks of both femurs - Plan: DG Bone Density. 2021 T-score -1.4 without elevated FRAX. Continue  Vitamin D + Calcium supplement and regular exercise. Recommend repeating DXA.   Screening for breast cancer - Normal mammogram history.  Continue annual screenings.  Normal breast exam today.  Screening for colon cancer - 2022 colonoscopy. Will repeat at GI's recommended interval.   Return in about 2 years (around 10/08/2024) for B&P.       Olivia Mackie DNP, 3:12 PM 10/09/2022

## 2022-10-12 LAB — CYTOLOGY - PAP
Comment: NEGATIVE
Diagnosis: NEGATIVE
High risk HPV: NEGATIVE

## 2022-10-16 ENCOUNTER — Ambulatory Visit
Admission: RE | Admit: 2022-10-16 | Discharge: 2022-10-16 | Disposition: A | Discharge status: Home / Self Care | Payer: Medicare HMO | Source: Ambulatory Visit | Attending: Family Medicine | Admitting: Family Medicine

## 2022-10-16 DIAGNOSIS — Z1231 Encounter for screening mammogram for malignant neoplasm of breast: Secondary | ICD-10-CM

## 2022-11-17 ENCOUNTER — Ambulatory Visit (HOSPITAL_BASED_OUTPATIENT_CLINIC_OR_DEPARTMENT_OTHER)
Admission: RE | Admit: 2022-11-17 | Discharge: 2022-11-17 | Disposition: A | Discharge status: Home / Self Care | Payer: Medicare HMO | Source: Ambulatory Visit | Attending: Nurse Practitioner | Admitting: Nurse Practitioner

## 2022-11-17 DIAGNOSIS — M85851 Other specified disorders of bone density and structure, right thigh: Secondary | ICD-10-CM | POA: Insufficient documentation

## 2022-11-17 DIAGNOSIS — M85852 Other specified disorders of bone density and structure, left thigh: Secondary | ICD-10-CM | POA: Insufficient documentation

## 2023-06-26 IMAGING — MG MM DIGITAL DIAGNOSTIC UNILAT*L* W/ TOMO W/ CAD
4 series · 4 of 12 positions shown · non-contrast
Comparison: Previous exam(s).

CLINICAL DATA: Patient returns after screening study for evaluation
possible LEFT breast mass. Patient had ultrasound-guided aspiration
to completion of a cyst in the 3 o'clock location of the LEFT breast
performed 08/08/2018.

EXAM:
DIGITAL DIAGNOSTIC UNILATERAL LEFT MAMMOGRAM WITH TOMOSYNTHESIS AND
CAD; ULTRASOUND LEFT BREAST LIMITED
TECHNIQUE: Left digital diagnostic mammography and breast tomosynthesis was
performed. The images were evaluated with computer-aided detection.;
Targeted ultrasound examination of the left breast was performed.

[L CC synth-2D]
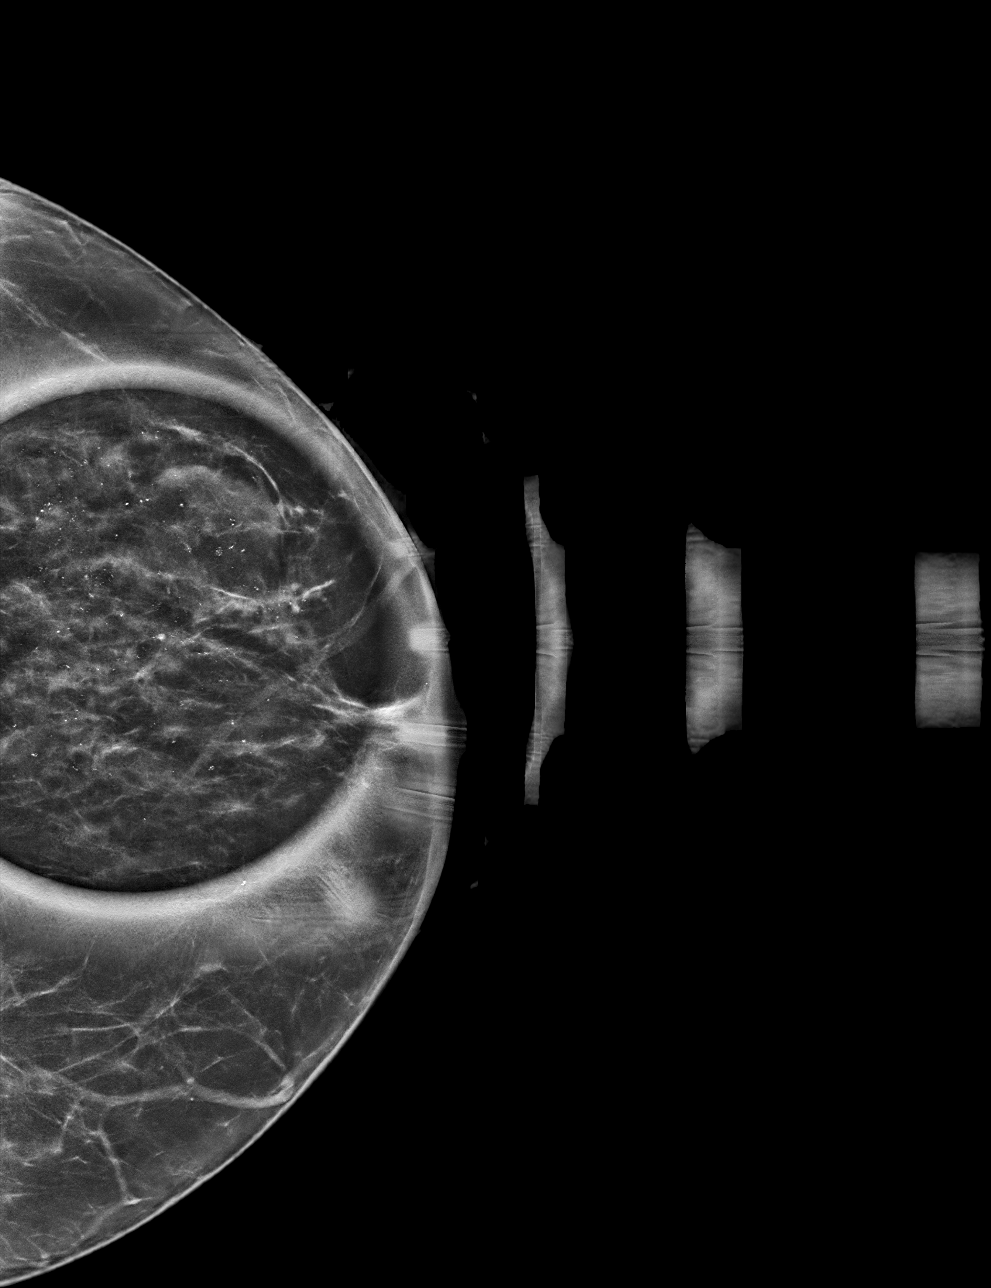

[L MLO synth-2D]
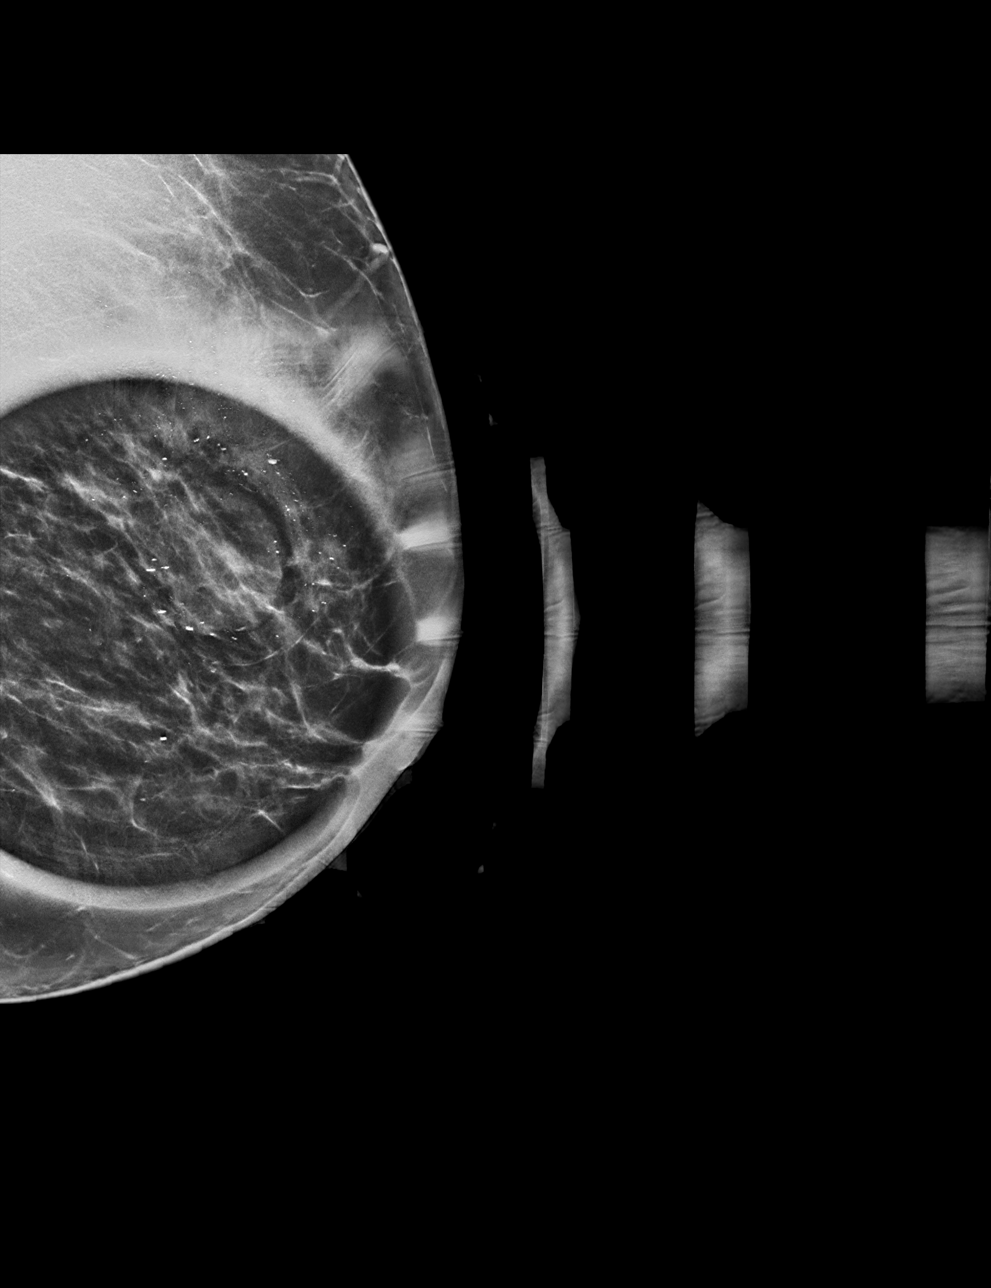

[L CC tomo · tomo slice 31/60.0]
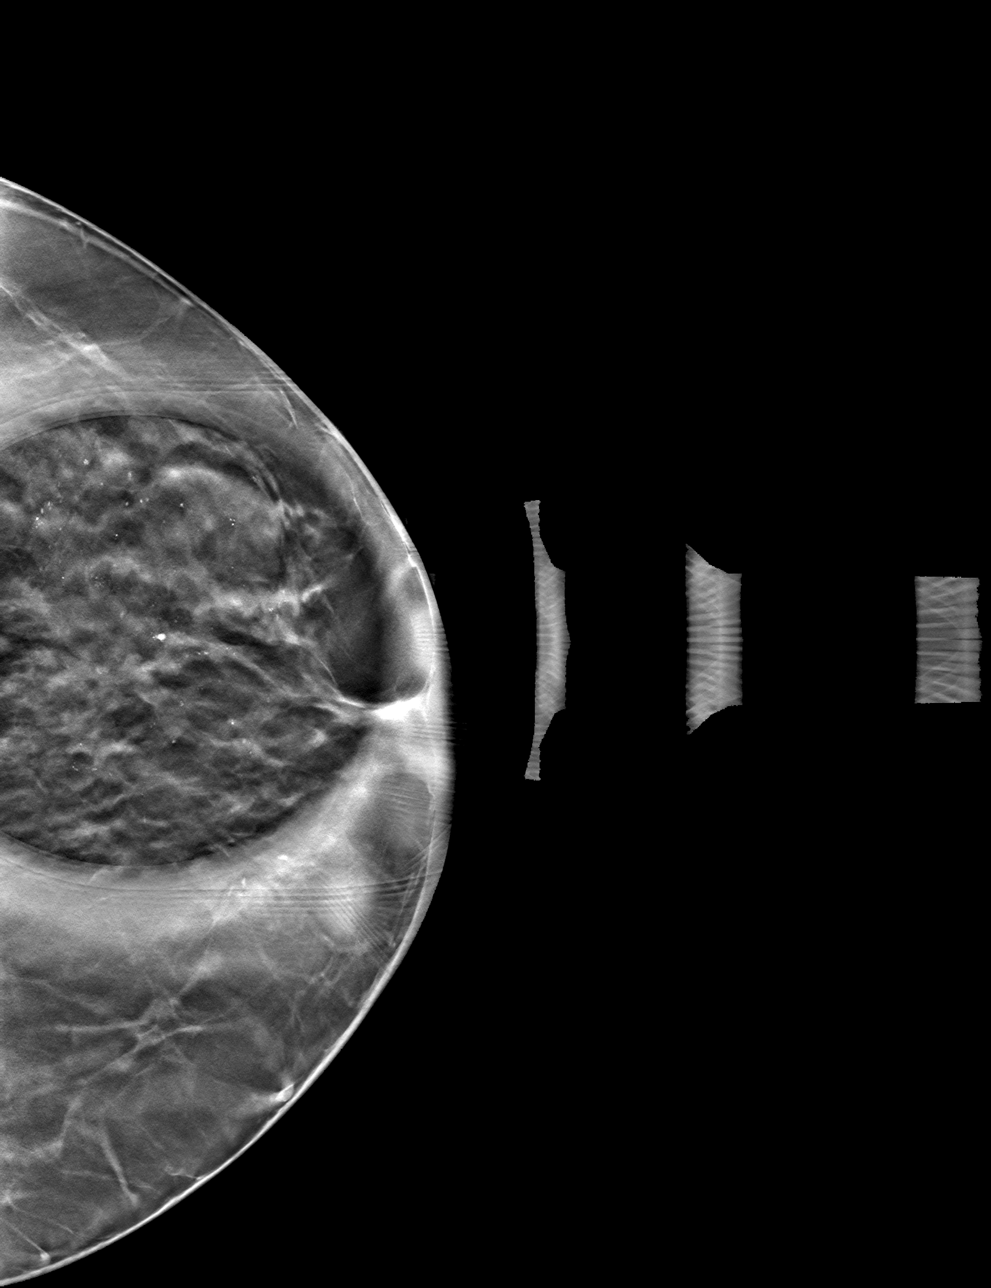

[L MLO tomo · tomo slice 33/64.0]
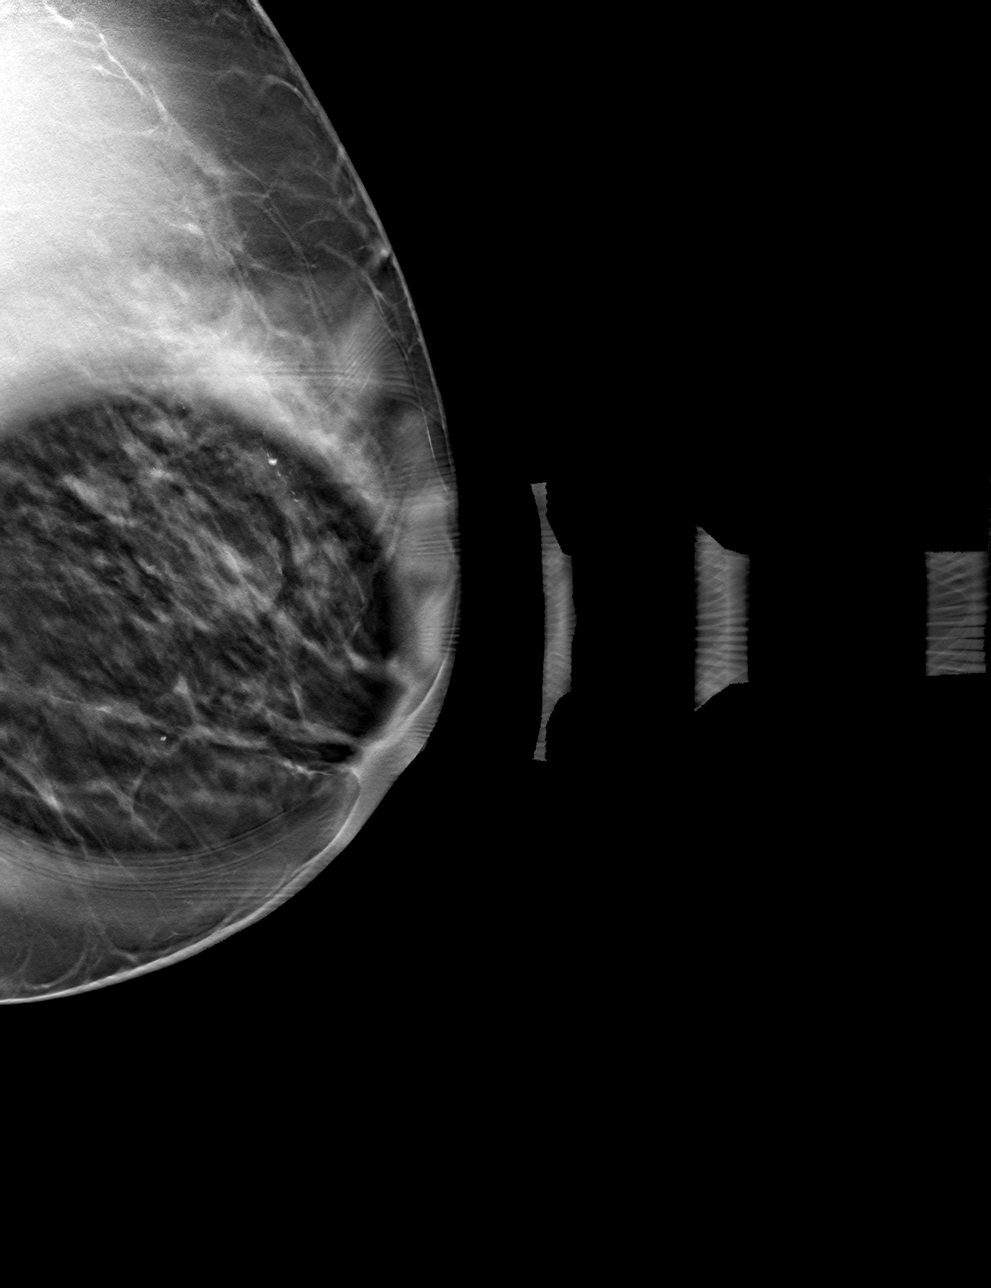

[4 of 12 positions shown; findings below may reference images not displayed]

ACR Breast Density Category c: The breast tissue is heterogeneously
dense, which may obscure small masses.
FINDINGS: Spot compression views confirm presence a circumscribed oval mass in
the LATERAL portion of the LEFT breast and further evaluated with
ultrasound. Stable diffuse layering calcifications.

Targeted ultrasound is performed, showing a simple cyst in the 3
o'clock location of the LEFT breast 1 centimeter from the nipple
which measures 2.5 x 1.6 x 2.6 centimeters. Scattered anechoic and
hypoechoic circumscribed masses are consistent with fibrocystic
changes. Patient is asymptomatic.
IMPRESSION: Benign fibrocystic changes in the LATERAL portion of the LEFT
breast. Simple cyst accounts for the abnormality on screening
mammogram.

RECOMMENDATION:
Screening mammogram in one year.(Code:KR-M-DS6)

I have discussed the findings and recommendations with the patient.
If applicable, a reminder letter will be sent to the patient
regarding the next appointment.

BI-RADS CATEGORY  2: Benign.

## 2023-09-18 ENCOUNTER — Other Ambulatory Visit: Payer: Self-pay | Admitting: Family Medicine

## 2023-09-18 DIAGNOSIS — Z1231 Encounter for screening mammogram for malignant neoplasm of breast: Secondary | ICD-10-CM

## 2023-10-17 ENCOUNTER — Ambulatory Visit
Admission: RE | Admit: 2023-10-17 | Discharge: 2023-10-17 | Disposition: A | Source: Ambulatory Visit | Attending: Family Medicine | Admitting: Family Medicine

## 2023-10-17 DIAGNOSIS — Z1231 Encounter for screening mammogram for malignant neoplasm of breast: Secondary | ICD-10-CM

## 2024-10-10 ENCOUNTER — Encounter: Admitting: Nurse Practitioner
